# Patient Record
Sex: Female | Born: 1975 | Race: White | Hispanic: No | Smoking: Former smoker
Health system: Southern US, Community
[De-identification: ages and names within clinical notes are randomized; demographics above are authoritative.]

## PROBLEM LIST (undated history)

## (undated) DIAGNOSIS — N809 Endometriosis, unspecified: Secondary | ICD-10-CM

## (undated) DIAGNOSIS — E785 Hyperlipidemia, unspecified: Secondary | ICD-10-CM

## (undated) HISTORY — PX: BREAST CYST EXCISION: SHX579

## (undated) HISTORY — PX: ABDOMINAL HYSTERECTOMY: SHX81

## (undated) HISTORY — PX: TUBAL LIGATION: SHX77

## (undated) HISTORY — DX: Hyperlipidemia, unspecified: E78.5

## (undated) HISTORY — PX: BREAST CYST ASPIRATION: SHX578

## (undated) HISTORY — DX: Endometriosis, unspecified: N80.9

## (undated) HISTORY — PX: LAPAROSCOPY: SHX197

---

## 1998-05-07 ENCOUNTER — Other Ambulatory Visit: Admission: RE | Admit: 1998-05-07 | Discharge: 1998-05-07 | Payer: Self-pay | Admitting: Obstetrics and Gynecology

## 1998-06-12 ENCOUNTER — Emergency Department (HOSPITAL_COMMUNITY): Admission: EM | Admit: 1998-06-12 | Discharge: 1998-06-13 | Payer: Self-pay

## 1998-09-14 ENCOUNTER — Ambulatory Visit (HOSPITAL_COMMUNITY): Admission: RE | Admit: 1998-09-14 | Discharge: 1998-09-14 | Payer: Self-pay | Admitting: *Deleted

## 1998-10-18 ENCOUNTER — Inpatient Hospital Stay (HOSPITAL_COMMUNITY): Admission: AD | Admit: 1998-10-18 | Discharge: 1998-10-18 | Payer: Self-pay | Admitting: Obstetrics and Gynecology

## 1998-11-12 ENCOUNTER — Inpatient Hospital Stay (HOSPITAL_COMMUNITY): Admission: AD | Admit: 1998-11-12 | Discharge: 1998-11-14 | Payer: Self-pay | Admitting: Obstetrics and Gynecology

## 1999-06-21 ENCOUNTER — Other Ambulatory Visit: Admission: RE | Admit: 1999-06-21 | Discharge: 1999-06-21 | Payer: Self-pay | Admitting: Obstetrics and Gynecology

## 1999-12-01 ENCOUNTER — Emergency Department (HOSPITAL_COMMUNITY): Admission: EM | Admit: 1999-12-01 | Discharge: 1999-12-01 | Payer: Self-pay | Admitting: Emergency Medicine

## 2000-09-12 ENCOUNTER — Other Ambulatory Visit: Admission: RE | Admit: 2000-09-12 | Discharge: 2000-09-12 | Payer: Self-pay | Admitting: *Deleted

## 2001-08-01 ENCOUNTER — Other Ambulatory Visit: Admission: RE | Admit: 2001-08-01 | Discharge: 2001-08-01 | Payer: Self-pay | Admitting: *Deleted

## 2002-07-23 ENCOUNTER — Other Ambulatory Visit: Admission: RE | Admit: 2002-07-23 | Discharge: 2002-07-23 | Payer: Self-pay | Admitting: *Deleted

## 2002-12-28 ENCOUNTER — Emergency Department (HOSPITAL_COMMUNITY): Admission: EM | Admit: 2002-12-28 | Discharge: 2002-12-28 | Payer: Self-pay | Admitting: Emergency Medicine

## 2002-12-29 ENCOUNTER — Emergency Department (HOSPITAL_COMMUNITY): Admission: EM | Admit: 2002-12-29 | Discharge: 2002-12-29 | Payer: Self-pay | Admitting: Emergency Medicine

## 2002-12-29 ENCOUNTER — Encounter: Payer: Self-pay | Admitting: Emergency Medicine

## 2003-10-14 ENCOUNTER — Other Ambulatory Visit: Admission: RE | Admit: 2003-10-14 | Discharge: 2003-10-14 | Payer: Self-pay | Admitting: *Deleted

## 2004-09-28 ENCOUNTER — Other Ambulatory Visit: Admission: RE | Admit: 2004-09-28 | Discharge: 2004-09-28 | Payer: Self-pay | Admitting: Obstetrics and Gynecology

## 2005-01-18 ENCOUNTER — Other Ambulatory Visit: Admission: RE | Admit: 2005-01-18 | Discharge: 2005-01-18 | Payer: Self-pay | Admitting: Obstetrics and Gynecology

## 2005-03-06 ENCOUNTER — Inpatient Hospital Stay (HOSPITAL_COMMUNITY): Admission: AD | Admit: 2005-03-06 | Discharge: 2005-03-06 | Payer: Self-pay | Admitting: Obstetrics and Gynecology

## 2005-03-29 ENCOUNTER — Inpatient Hospital Stay (HOSPITAL_COMMUNITY): Admission: AD | Admit: 2005-03-29 | Discharge: 2005-03-31 | Payer: Self-pay | Admitting: Obstetrics and Gynecology

## 2006-03-21 ENCOUNTER — Other Ambulatory Visit: Admission: RE | Admit: 2006-03-21 | Discharge: 2006-03-21 | Payer: Self-pay | Admitting: *Deleted

## 2006-04-01 ENCOUNTER — Inpatient Hospital Stay (HOSPITAL_COMMUNITY): Admission: AD | Admit: 2006-04-01 | Discharge: 2006-04-01 | Payer: Self-pay | Admitting: *Deleted

## 2006-10-23 ENCOUNTER — Ambulatory Visit (HOSPITAL_COMMUNITY): Admission: RE | Admit: 2006-10-23 | Discharge: 2006-10-23 | Payer: Self-pay | Admitting: Family Medicine

## 2008-06-15 ENCOUNTER — Emergency Department (HOSPITAL_COMMUNITY): Admission: EM | Admit: 2008-06-15 | Discharge: 2008-06-15 | Payer: Self-pay | Admitting: Emergency Medicine

## 2008-12-10 ENCOUNTER — Emergency Department (HOSPITAL_COMMUNITY): Admission: EM | Admit: 2008-12-10 | Discharge: 2008-12-10 | Payer: Self-pay | Admitting: Family Medicine

## 2009-08-12 ENCOUNTER — Emergency Department (HOSPITAL_COMMUNITY): Admission: EM | Admit: 2009-08-12 | Discharge: 2009-08-12 | Payer: Self-pay | Admitting: Family Medicine

## 2009-09-16 ENCOUNTER — Ambulatory Visit (HOSPITAL_COMMUNITY): Admission: RE | Admit: 2009-09-16 | Discharge: 2009-09-17 | Payer: Self-pay | Admitting: Obstetrics and Gynecology

## 2009-09-16 ENCOUNTER — Encounter (INDEPENDENT_AMBULATORY_CARE_PROVIDER_SITE_OTHER): Payer: Self-pay | Admitting: Obstetrics and Gynecology

## 2010-03-16 ENCOUNTER — Emergency Department (HOSPITAL_COMMUNITY): Admission: EM | Admit: 2010-03-16 | Discharge: 2010-03-16 | Payer: Self-pay | Admitting: Emergency Medicine

## 2010-04-16 ENCOUNTER — Emergency Department (HOSPITAL_COMMUNITY): Admission: EM | Admit: 2010-04-16 | Discharge: 2010-04-16 | Payer: Self-pay | Admitting: Emergency Medicine

## 2010-09-21 ENCOUNTER — Emergency Department (HOSPITAL_COMMUNITY): Admission: EM | Admit: 2010-09-21 | Discharge: 2010-09-21 | Payer: Self-pay | Admitting: Emergency Medicine

## 2011-01-04 ENCOUNTER — Inpatient Hospital Stay (INDEPENDENT_AMBULATORY_CARE_PROVIDER_SITE_OTHER)
Admission: RE | Admit: 2011-01-04 | Discharge: 2011-01-04 | Disposition: A | Discharge status: Home / Self Care | Payer: BC Managed Care – PPO | Source: Ambulatory Visit | Attending: Family Medicine | Admitting: Family Medicine

## 2011-01-04 DIAGNOSIS — J019 Acute sinusitis, unspecified: Secondary | ICD-10-CM

## 2011-02-02 LAB — CBC
HCT: 30.1 % — ABNORMAL LOW (ref 36.0–46.0)
HCT: 38.4 % (ref 36.0–46.0)
Hemoglobin: 10.2 g/dL — ABNORMAL LOW (ref 12.0–15.0)
Hemoglobin: 12.8 g/dL (ref 12.0–15.0)
MCHC: 33.4 g/dL (ref 30.0–36.0)
MCHC: 33.8 g/dL (ref 30.0–36.0)
MCV: 90.7 fL (ref 78.0–100.0)
MCV: 91.4 fL (ref 78.0–100.0)
Platelets: 143 10*3/uL — ABNORMAL LOW (ref 150–400)
Platelets: 193 10*3/uL (ref 150–400)
RBC: 3.29 MIL/uL — ABNORMAL LOW (ref 3.87–5.11)
RBC: 4.24 MIL/uL (ref 3.87–5.11)
RDW: 12.6 % (ref 11.5–15.5)
RDW: 12.7 % (ref 11.5–15.5)
WBC: 11.8 10*3/uL — ABNORMAL HIGH (ref 4.0–10.5)
WBC: 8.5 10*3/uL (ref 4.0–10.5)

## 2011-02-15 LAB — POCT RAPID STREP A (OFFICE): Streptococcus, Group A Screen (Direct): POSITIVE — AB

## 2011-05-12 ENCOUNTER — Emergency Department (HOSPITAL_COMMUNITY)
Admission: EM | Admit: 2011-05-12 | Discharge: 2011-05-12 | Disposition: A | Discharge status: Home / Self Care | Payer: BC Managed Care – PPO | Attending: Emergency Medicine | Admitting: Emergency Medicine

## 2011-05-12 ENCOUNTER — Emergency Department (HOSPITAL_COMMUNITY): Payer: BC Managed Care – PPO

## 2011-05-12 DIAGNOSIS — W010XXA Fall on same level from slipping, tripping and stumbling without subsequent striking against object, initial encounter: Secondary | ICD-10-CM | POA: Insufficient documentation

## 2011-05-12 DIAGNOSIS — S93609A Unspecified sprain of unspecified foot, initial encounter: Secondary | ICD-10-CM | POA: Insufficient documentation

## 2011-05-12 DIAGNOSIS — M79609 Pain in unspecified limb: Secondary | ICD-10-CM | POA: Insufficient documentation

## 2011-05-12 DIAGNOSIS — M25579 Pain in unspecified ankle and joints of unspecified foot: Secondary | ICD-10-CM | POA: Insufficient documentation

## 2011-05-12 DIAGNOSIS — M25559 Pain in unspecified hip: Secondary | ICD-10-CM | POA: Insufficient documentation

## 2011-05-12 DIAGNOSIS — M25569 Pain in unspecified knee: Secondary | ICD-10-CM | POA: Insufficient documentation

## 2011-09-02 ENCOUNTER — Inpatient Hospital Stay (INDEPENDENT_AMBULATORY_CARE_PROVIDER_SITE_OTHER)
Admission: RE | Admit: 2011-09-02 | Discharge: 2011-09-02 | Disposition: A | Discharge status: Home / Self Care | Payer: BC Managed Care – PPO | Source: Ambulatory Visit | Attending: Family Medicine | Admitting: Family Medicine

## 2011-09-02 DIAGNOSIS — J40 Bronchitis, not specified as acute or chronic: Secondary | ICD-10-CM

## 2011-09-02 DIAGNOSIS — J019 Acute sinusitis, unspecified: Secondary | ICD-10-CM

## 2011-12-30 ENCOUNTER — Encounter (INDEPENDENT_AMBULATORY_CARE_PROVIDER_SITE_OTHER): Payer: BC Managed Care – PPO | Admitting: Obstetrics and Gynecology

## 2011-12-30 DIAGNOSIS — Z85850 Personal history of malignant neoplasm of thyroid: Secondary | ICD-10-CM

## 2011-12-30 DIAGNOSIS — N951 Menopausal and female climacteric states: Secondary | ICD-10-CM

## 2012-02-16 ENCOUNTER — Emergency Department (HOSPITAL_COMMUNITY)
Admission: EM | Admit: 2012-02-16 | Discharge: 2012-02-16 | Disposition: A | Discharge status: Hospice | Payer: BC Managed Care – PPO | Attending: Emergency Medicine | Admitting: Emergency Medicine

## 2012-02-16 ENCOUNTER — Encounter (HOSPITAL_COMMUNITY): Payer: Self-pay | Admitting: Emergency Medicine

## 2012-02-16 DIAGNOSIS — J329 Chronic sinusitis, unspecified: Secondary | ICD-10-CM | POA: Insufficient documentation

## 2012-02-16 MED ORDER — CETIRIZINE-PSEUDOEPHEDRINE ER 5-120 MG PO TB12: 1.0000 | ORAL_TABLET | Freq: Every day | ORAL | Status: DC

## 2012-02-16 MED ORDER — FLUTICASONE PROPIONATE 50 MCG/ACT NA SUSP: 2.0000 | Freq: Every day | NASAL | Status: DC

## 2012-02-16 NOTE — ED Notes (Signed)
Pt c/o sinus pain, denies SOB or other complaints, NAD

## 2012-02-16 NOTE — ED Provider Notes (Signed)
History     CSN: [id number]  Arrival date & time 02/16/12  4098   First MD Initiated Contact with Patient 02/16/12 1007      Chief Complaint  Patient presents with  . Facial Pain    (Consider location/radiation/quality/duration/timing/severity/associated sxs/prior treatment) Patient is a 36 y.o. female presenting with sinusitis. The history is provided by the patient.  Sinusitis  This is a new problem. The current episode started more than 2 days ago. The problem has not changed since onset.There has been no fever. The pain is at a severity of 3/10. The pain is mild. The pain has been constant since onset. Associated symptoms include congestion and sinus pressure. Pertinent negatives include no chills, no ear pain, no hoarse voice, no sore throat, no cough and no shortness of breath.  Pt states she gets sinus infections every year around this time. States the only thing that works is antibiotics. States yellow nasal discharge, pressure over maxillary sinuses. Tried netty pot, dayquil with no relief. No fever, chills, no cough, no other complaints.   History reviewed. No pertinent past medical history.  Past Surgical History  Procedure Date  . Abdominal hysterectomy     No family history on file.  History  Substance Use Topics  . Smoking status: Never Smoker   . Smokeless tobacco: Not on file  . Alcohol Use: No    OB History    Grav Para Term Preterm Abortions TAB SAB Ect Mult Living                  Review of Systems  Constitutional: Negative for fever and chills.  HENT: Positive for congestion and sinus pressure. Negative for ear pain, sore throat, hoarse voice, neck pain and neck stiffness.   Eyes: Negative.   Respiratory: Negative for cough and shortness of breath.   Cardiovascular: Negative.   Gastrointestinal: Negative.   Musculoskeletal: Negative for myalgias.  Skin: Negative.   Neurological: Negative for dizziness and headaches.    Allergies  Review of  patient's allergies indicates no known allergies.  Home Medications   Current Outpatient Rx  Name Route Sig Dispense Refill  . CALCIUM CARBONATE-VITAMIN D 500-200 MG-UNIT PO TABS Oral Take 1 tablet by mouth at bedtime.    Marland Kitchen ESTRADIOL 1 MG PO TABS Oral Take 1 mg by mouth at bedtime.      BP 111/76  Pulse 79  Temp(Src) 97.6 F (36.4 C) (Oral)  Resp 16  Wt 150 lb (68.04 kg)  SpO2 100%  Physical Exam  Nursing note and vitals reviewed. Constitutional: She appears well-developed and well-nourished. No distress.  HENT:  Head: Normocephalic.  Right Ear: Tympanic membrane, external ear and ear canal normal.  Left Ear: Tympanic membrane, external ear and ear canal normal.  Nose: Rhinorrhea present. Right sinus exhibits maxillary sinus tenderness. Right sinus exhibits no frontal sinus tenderness. Left sinus exhibits maxillary sinus tenderness. Left sinus exhibits no frontal sinus tenderness.  Mouth/Throat: Uvula is midline, oropharynx is clear and moist and mucous membranes are normal. No oropharyngeal exudate, posterior oropharyngeal edema, posterior oropharyngeal erythema or tonsillar abscesses.  Eyes: Conjunctivae are normal.  Neck: Normal range of motion. Neck supple.  Cardiovascular: Normal rate, regular rhythm and normal heart sounds.   Pulmonary/Chest: Effort normal and breath sounds normal. No respiratory distress. She has no wheezes. She has no rales.  Musculoskeletal: Normal range of motion. She exhibits no edema.  Lymphadenopathy:    She has no cervical adenopathy.  Neurological: She is alert.  Skin: Skin is warm and dry.  Psychiatric: She has a normal mood and affect.    ED Course  Procedures (including critical care time)  Pt with maxillary sinuses pressure, and nasal congestion. She is afebrile, non toxic, no other complaints. This sounds like possible allergic vs viral sinusitis. Will start on flonase, continue saline flushes, zyrtec, sudafed. Will follow up with  pcp  1. Sinusitis       MDM         Lottie Mussel, PA 02/16/12 1620

## 2012-02-16 NOTE — Discharge Instructions (Signed)
Continue saline nasal spray or netti pot. Start taking zyrtec-sudafed as prescribed. Start daily flonase for sinusitis. Follow up with a primary care doctor if not improving.   RESOURCE GUIDE  Dental Problems  Patients with Medicaid: [facility] [phone] [address]                                           [phone] [address] Phone:  [phone]                                                  Phone:  [phone]  If unable to pay or uninsured, contact:  [facility] or [facility]. to become qualified for the adult dental clinic.  Chronic Pain Problems Contact [facility]  [phone] Patients need to be referred by their primary care doctor.  Insufficient Money for Medicine Contact [employer]:  call "211" or [facility] [phone].  No Primary Care Doctor Call [facility]  [phone] Other agencies that provide inexpensive medical care    [facility]  [phone]    [facility]  [phone]    [facility]  [phone]    [facility]  [phone]    [facility]  [phone]    [facility]  [phone]  [facility]  [phone] [facility]  [phone] [facility]   [phone] (emergency services [phone])  Substance Abuse Resources [facility]  [phone] [facility] [phone] [facility] 712-656-0309 [facility] [phone] Residential & Outpatient Substance Abuse Program  [phone]  Abuse/Neglect [facility] Child Abuse Hotline 816-708-3206 [facility] Child Abuse Hotline (216)622-1216 (After Hours)  Emergency Shelter [facility] [phone]  Maternity Homes [facility] [place] [facility] [phone] [facility] [phone]  MRSA Hotline #:   4798090127    [facility]  Resources  [facility] [place]     [employer]                          [facility] Dept. [address] [place]                       [address]      [address]  [place]                                                Cristobal Goldmann Phone:  [phone]                                   Phone:  [phone]                 Phone:  [phone]  [facility] Mental Health Phone:  [phone]  [facility] Child Abuse Hotline [phone] [phone] (After Hours)

## 2012-02-17 NOTE — ED Provider Notes (Signed)
Medical screening examination/treatment/procedure(s) were performed by non-physician practitioner and as supervising physician I was immediately available for consultation/collaboration.    Celene Kras, MD 02/17/12 [phone]

## 2012-02-20 ENCOUNTER — Encounter (HOSPITAL_COMMUNITY): Payer: Self-pay | Admitting: Emergency Medicine

## 2012-02-20 ENCOUNTER — Emergency Department (INDEPENDENT_AMBULATORY_CARE_PROVIDER_SITE_OTHER)
Admission: EM | Admit: 2012-02-20 | Discharge: 2012-02-20 | Disposition: A | Discharge status: Home / Self Care | Payer: BC Managed Care – PPO | Source: Home / Self Care | Attending: Emergency Medicine | Admitting: Emergency Medicine

## 2012-02-20 DIAGNOSIS — J329 Chronic sinusitis, unspecified: Secondary | ICD-10-CM

## 2012-02-20 MED ORDER — CETIRIZINE-PSEUDOEPHEDRINE ER 5-120 MG PO TB12: 1.0000 | ORAL_TABLET | Freq: Every day | ORAL | Status: DC

## 2012-02-20 MED ORDER — AMOXICILLIN 500 MG PO CAPS: 500.0000 mg | ORAL_CAPSULE | Freq: Three times a day (TID) | ORAL | Status: AC

## 2012-02-20 NOTE — ED Notes (Signed)
Pt here with sinus sx pain behind eyes,sore throat and throb h/a that started last Thursday seen in [place] and prescribed Flonase spray and zyrtec but pt states the sx are worsening with chest/back pressure and ache and no relief in h/a.vss

## 2012-02-20 NOTE — ED Provider Notes (Signed)
History     CSN: [id number]  Arrival date & time 02/20/12  4098   First MD Initiated Contact with Patient 02/20/12 [phone]      Chief Complaint  Patient presents with  . Sinusitis  . URI    (Consider location/radiation/quality/duration/timing/severity/associated sxs/prior treatment) HPI Comments: Continue to feel the same or even worse pressure on my sinuses" (points to both maxillary sinuses), with dripping behind my throat, and a sore throat, with some coughing  Patient is a 36 y.o. female presenting with sinusitis and URI. The history is provided by the patient.  Sinusitis  This is a new problem. The problem has been gradually worsening. There has been no fever. The pain is at a severity of 7/10. The pain is moderate. The pain has been constant since onset. Associated symptoms include congestion and ear pain. Pertinent negatives include no chills, no swollen glands, no cough and no shortness of breath. She has tried other medications for the symptoms. The treatment provided no relief.  URI The primary symptoms include ear pain. Primary symptoms do not include fever, fatigue, swollen glands or cough.  Symptoms associated with the illness include congestion. The illness is not associated with chills.    History reviewed. No pertinent past medical history.  Past Surgical History  Procedure Date  . Abdominal hysterectomy     No family history on file.  History  Substance Use Topics  . Smoking status: Never Smoker   . Smokeless tobacco: Not on file  . Alcohol Use: No    OB History    Grav Para Term Preterm Abortions TAB SAB Ect Mult Living                  Review of Systems  Constitutional: Negative for fever, chills, activity change, appetite change and fatigue.  HENT: Positive for ear pain and congestion.   Respiratory: Negative for cough and shortness of breath.     Allergies  Review of patient's allergies indicates no known allergies.  Home Medications    Current Outpatient Rx  Name Route Sig Dispense Refill  . AMOXICILLIN 500 MG PO CAPS Oral Take 1 capsule (500 mg total) by mouth 3 (three) times daily. 30 capsule 0  . CALCIUM CARBONATE-VITAMIN D 500-200 MG-UNIT PO TABS Oral Take 1 tablet by mouth at bedtime.    Marland Kitchen CETIRIZINE-PSEUDOEPHEDRINE ER 5-120 MG PO TB12 Oral Take 1 tablet by mouth daily. 30 tablet 0  . ESTRADIOL 1 MG PO TABS Oral Take 1 mg by mouth at bedtime.    Marland Kitchen FLUTICASONE PROPIONATE 50 MCG/ACT NA SUSP Nasal Place 2 sprays into the nose daily. 16 g 1    BP 116/64  Pulse 82  Temp(Src) 98.4 F (36.9 C) (Oral)  Resp 16  SpO2 100%  Physical Exam  Nursing note and vitals reviewed. Constitutional: She appears well-developed and well-nourished.  HENT:  Head: Normocephalic.    Right Ear: Tympanic membrane normal.  Left Ear: Tympanic membrane normal.  Mouth/Throat: Uvula is midline and mucous membranes are normal. Posterior oropharyngeal erythema present. No oropharyngeal exudate.  Eyes: Conjunctivae and EOM are normal.  Neck: Neck supple.  Cardiovascular: Normal rate.   Pulmonary/Chest: Effort normal and breath sounds normal. No respiratory distress.  Lymphadenopathy:    She has no cervical adenopathy.  Skin: No erythema.    ED Course  Procedures (including critical care time)  Labs Reviewed - No data to display No results found.   1. Sinusitis       MDM  Sinusitis, failing decongestant and antihistaminic treatment-worsening. Afebrile optimized treatment encouraged to wait 2 more days before antibiotic commitment.        Jimmie Molly, MD 02/20/12 1731

## 2012-02-20 NOTE — Discharge Instructions (Signed)
  As discussed use prescribed medicine for 1-2 days and if no improvement along with aggressive hydration and humidifier use start with prescribed antibiotics. Continue with your nasal steroid sprays. Once you complete Zyrtec D. cycle resumes oral normal over-the-counter Zyrtec  Sinusitis Sinuses are air pockets within the bones of your face. The growth of bacteria within a sinus leads to infection. The infection prevents the sinuses from draining. This infection is called sinusitis. SYMPTOMS  There will be different areas of pain depending on which sinuses have become infected.  The maxillary sinuses often produce pain beneath the eyes.   Frontal sinusitis may cause pain in the middle of the forehead and above the eyes.  Other problems (symptoms) include:  Toothaches.   Colored, pus-like (purulent) drainage from the nose.   Swelling, warmth, and tenderness over the sinus areas may be signs of infection.  TREATMENT  Sinusitis is most often determined by an exam.X-rays may be taken. If x-rays have been taken, make sure you obtain your results or find out how you are to obtain them. Your caregiver may give you medications (antibiotics). These are medications that will help kill the bacteria causing the infection. You may also be given a medication (decongestant) that helps to reduce sinus swelling.  HOME CARE INSTRUCTIONS   Only take over-the-counter or prescription medicines for pain, discomfort, or fever as directed by your caregiver.   Drink extra fluids. Fluids help thin the mucus so your sinuses can drain more easily.   Applying either moist heat or ice packs to the sinus areas may help relieve discomfort.   Use saline nasal sprays to help moisten your sinuses. The sprays can be found at your local drugstore.  SEEK IMMEDIATE MEDICAL CARE IF:  You have a fever.   You have increasing pain, severe headaches, or toothache.   You have nausea, vomiting, or drowsiness.   You develop  unusual swelling around the face or trouble seeing.  MAKE SURE YOU:   Understand these instructions.   Will watch your condition.   Will get help right away if you are not doing well or get worse.  Document Released: 10/17/2005 Document Revised: 10/06/2011 Document Reviewed: 05/16/2007 [facility] Patient Information 2012 [place], [place].

## 2012-10-09 ENCOUNTER — Telehealth: Payer: Self-pay | Admitting: Obstetrics and Gynecology

## 2012-10-09 NOTE — Telephone Encounter (Signed)
VM from pt requesting Estrogen until appt 11/07/12.

## 2012-10-10 ENCOUNTER — Other Ambulatory Visit: Payer: Self-pay | Admitting: Obstetrics and Gynecology

## 2012-10-10 ENCOUNTER — Ambulatory Visit: Payer: BC Managed Care – PPO | Admitting: Obstetrics and Gynecology

## 2012-10-10 MED ORDER — ESTRADIOL 1 MG PO TABS: 1.0000 mg | ORAL_TABLET | Freq: Every day | ORAL | Status: DC

## 2012-10-10 NOTE — Telephone Encounter (Signed)
Pt e-prescribed refill of Estrogen until AEX 11/07/12.

## 2012-11-02 DIAGNOSIS — N809 Endometriosis, unspecified: Secondary | ICD-10-CM | POA: Insufficient documentation

## 2012-11-07 ENCOUNTER — Ambulatory Visit (INDEPENDENT_AMBULATORY_CARE_PROVIDER_SITE_OTHER): Payer: BC Managed Care – PPO

## 2012-11-07 ENCOUNTER — Ambulatory Visit (INDEPENDENT_AMBULATORY_CARE_PROVIDER_SITE_OTHER): Payer: BC Managed Care – PPO | Admitting: Obstetrics and Gynecology

## 2012-11-07 ENCOUNTER — Encounter: Payer: Self-pay | Admitting: Obstetrics and Gynecology

## 2012-11-07 ENCOUNTER — Telehealth: Payer: Self-pay | Admitting: Obstetrics and Gynecology

## 2012-11-07 VITALS — BP 100/64 | HR 70 | Resp 16 | Ht 67.0 in | Wt 150.0 lb

## 2012-11-07 DIAGNOSIS — Z01419 Encounter for gynecological examination (general) (routine) without abnormal findings: Secondary | ICD-10-CM

## 2012-11-07 DIAGNOSIS — Z13820 Encounter for screening for osteoporosis: Secondary | ICD-10-CM

## 2012-11-07 DIAGNOSIS — Z124 Encounter for screening for malignant neoplasm of cervix: Secondary | ICD-10-CM

## 2012-11-07 MED ORDER — ESTRADIOL 1 MG PO TABS: 1.0000 mg | ORAL_TABLET | Freq: Every day | ORAL | Status: DC

## 2012-11-07 NOTE — Progress Notes (Addendum)
Contraception Hysterectomy and BSO secondary to endometriosis Last pap 08/13/2010 Last Mammo None Last Colonoscopy None Last Dexa Scan None Primary MD [facility] Abuse at Home None  No complaints.  Interested in BDS secondary to lack of ovaries for several yrs now taking estradiol and needs refills.  Filed Vitals:   11/07/12 1603  BP: 100/64  Pulse: 70  Resp: 16   ROS: noncontributory  Physical Examination: General appearance - alert, well appearing, and in no distress Neck - supple, no significant adenopathy Chest - clear to auscultation, no wheezes, rales or rhonchi, symmetric air entry Heart - normal rate and regular rhythm Abdomen - soft, nontender, nondistended, no masses or organomegaly Breasts - breasts appear normal, no suspicious masses, no skin or nipple changes or axillary nodes Pelvic - normal external genitalia, vulva, vagina, vaginal cuff appears nl Back exam - no CVAT Extremities - no edema, redness or tenderness in the calves or thighs  A/P Baseline BDS secondary to h/o bso for several yrs - nl BDS today - consider repeat in 68yrs rec Ca with Vit D Rx estradiol

## 2012-11-08 NOTE — Addendum Note (Signed)
Addended by: Stephens Shire on: 11/08/2012 12:48 PM   Modules accepted: Orders

## 2012-11-09 LAB — PAP IG W/ RFLX HPV ASCU

## 2013-02-10 ENCOUNTER — Emergency Department (INDEPENDENT_AMBULATORY_CARE_PROVIDER_SITE_OTHER)
Admission: EM | Admit: 2013-02-10 | Discharge: 2013-02-10 | Disposition: A | Discharge status: Home / Self Care | Payer: BC Managed Care – PPO | Source: Home / Self Care | Attending: Family Medicine | Admitting: Family Medicine

## 2013-02-10 ENCOUNTER — Encounter (HOSPITAL_COMMUNITY): Payer: Self-pay | Admitting: *Deleted

## 2013-02-10 DIAGNOSIS — J329 Chronic sinusitis, unspecified: Secondary | ICD-10-CM

## 2013-02-10 DIAGNOSIS — J40 Bronchitis, not specified as acute or chronic: Secondary | ICD-10-CM

## 2013-02-10 MED ORDER — CETIRIZINE-PSEUDOEPHEDRINE ER 5-120 MG PO TB12: 1.0000 | ORAL_TABLET | Freq: Two times a day (BID) | ORAL | Status: DC

## 2013-02-10 MED ORDER — ALBUTEROL SULFATE HFA 108 (90 BASE) MCG/ACT IN AERS: 1.0000 | INHALATION_SPRAY | Freq: Four times a day (QID) | RESPIRATORY_TRACT | Status: DC | PRN

## 2013-02-10 MED ORDER — AMOXICILLIN 500 MG PO CAPS: 500.0000 mg | ORAL_CAPSULE | Freq: Three times a day (TID) | ORAL | Status: DC

## 2013-02-10 MED ORDER — PREDNISONE 20 MG PO TABS: ORAL_TABLET | ORAL | Status: DC

## 2013-02-10 MED ORDER — IBUPROFEN 600 MG PO TABS: 600.0000 mg | ORAL_TABLET | Freq: Three times a day (TID) | ORAL | Status: DC | PRN

## 2013-02-10 NOTE — ED Provider Notes (Signed)
History     CSN: [id number]  Arrival date & time 02/10/13  1101   First MD Initiated Contact with Patient 02/10/13 1108      Chief Complaint  Patient presents with  . Sore Throat    (Consider location/radiation/quality/duration/timing/severity/associated sxs/prior treatment) HPI Comments: 37 year old smoker female with history of recurrent sinusitis. Here complaining of nasal congestion postnasal drip for one week. Symptoms not associated with sore throat, fever and chills fever up to 100.1 F last night. Also reports coughing spells with sporadic wheezing and yellow sputum. Her nasal discharge is now thick and yellow as well. She's also experiencing sinus tenderness. 44-year-old daughter diagnose today with strep positive test.  Patient also reports she was doing her work around the house yesterday lifting a things with her right hand. Today she experienced right-sided neck pain at wake up worse with movement.   Past Medical History  Diagnosis Date  . Endometriosis     Past Surgical History  Procedure Laterality Date  . Abdominal hysterectomy    . Tubal ligation    . Laparoscopy      No family history on file.  History  Substance Use Topics  . Smoking status: Never Smoker   . Smokeless tobacco: Not on file  . Alcohol Use: No    OB History   Grav Para Term Preterm Abortions TAB SAB Ect Mult Living   2 2              Review of Systems  Constitutional: Positive for fever and chills.  HENT: Positive for congestion, sore throat, rhinorrhea, neck pain and sinus pressure. Negative for trouble swallowing and neck stiffness.   Respiratory: Positive for cough, shortness of breath and wheezing.   Cardiovascular: Negative for chest pain and leg swelling.  Gastrointestinal: Negative for nausea, vomiting and abdominal pain.  Skin: Negative for rash.  Neurological: Positive for headaches.    Allergies  Review of patient's allergies indicates no known allergies.  Home  Medications   Current Outpatient Rx  Name  Route  Sig  Dispense  Refill  . albuterol (PROVENTIL HFA;VENTOLIN HFA) 108 (90 BASE) MCG/ACT inhaler   Inhalation   Inhale 1-2 puffs into the lungs every 6 (six) hours as needed for wheezing.   1 Inhaler   0   . amoxicillin (AMOXIL) 500 MG capsule   Oral   Take 1 capsule (500 mg total) by mouth 3 (three) times daily.   21 capsule   0   . calcium-vitamin D (OSCAL WITH D) 500-200 MG-UNIT per tablet   Oral   Take 1 tablet by mouth at bedtime.         . cetirizine-pseudoephedrine (ZYRTEC-D) 5-120 MG per tablet   Oral   Take 1 tablet by mouth 2 (two) times daily.   30 tablet   0   . estradiol (ESTRACE) 1 MG tablet   Oral   Take 1 tablet (1 mg total) by mouth at bedtime.   30 tablet   11   . ibuprofen (ADVIL,MOTRIN) 600 MG tablet   Oral   Take 1 tablet (600 mg total) by mouth every 8 (eight) hours as needed for pain.   20 tablet   0   . predniSONE (DELTASONE) 20 MG tablet      2 tabs by mouth daily for 5 days   10 tablet   0     BP 114/79  Pulse 84  Temp(Src) 98.9 F (37.2 C) (Oral)  Resp 16  SpO2  100%  Physical Exam  Nursing note and vitals reviewed. Constitutional: She is oriented to person, place, and time. She appears well-developed and well-nourished. No distress.  HENT:  Head: Normocephalic and atraumatic.  Right Ear: External ear normal.  Left Ear: External ear normal.  Nasal Congestion with erythema and swelling of nasal turbinates, septal deviation towards right. White/yellow rhinorrhea. Significant pharyngeal erythema no exudates. No uvula deviation. No trismus. TM's normal.  Eyes: Conjunctivae and EOM are normal. Pupils are equal, round, and reactive to light. Right eye exhibits no discharge. Left eye exhibits no discharge.  Neck: Neck supple.  Cardiovascular: Normal rate, regular rhythm and normal heart sounds.   Pulmonary/Chest: Effort normal. She has no wheezes. She has no rales. She exhibits no  tenderness.  Scant sporadic rhonchi bilaterally. No active wheezing or dyspnea. Bronchitic cough.  Musculoskeletal:  Tenderness of right trapezius muscle increased with ROM.   Lymphadenopathy:    She has cervical adenopathy.  Neurological: She is alert and oriented to person, place, and time.  Skin: No rash noted. She is not diaphoretic.    ED Course  Procedures (including critical care time)  Labs Reviewed - No data to display No results found.   1. Bronchitis   2. Sinusitis       MDM  Daughter with strep positive test here.  Patient treated with amoxicillin, albuterol, cetirizine/pseudoephedrine, ibuprofen and prednisone. Supportive care and red flags that should prompt her return to medical attention discussed with patient and provided in writing.       Sharin Grave, MD 02/11/13 1015

## 2013-02-10 NOTE — ED Notes (Signed)
Patient complains of sore throat and yellow drainage from sinuses x 1 week with fever/chills. Patient states pain now radiates to back of neck.

## 2014-09-01 ENCOUNTER — Encounter (HOSPITAL_COMMUNITY): Payer: Self-pay | Admitting: *Deleted

## 2014-11-17 ENCOUNTER — Encounter: Payer: Self-pay | Admitting: *Deleted

## 2015-07-21 ENCOUNTER — Ambulatory Visit (INDEPENDENT_AMBULATORY_CARE_PROVIDER_SITE_OTHER): Payer: BLUE CROSS/BLUE SHIELD | Admitting: Family Medicine

## 2015-07-21 ENCOUNTER — Encounter: Payer: Self-pay | Admitting: Family Medicine

## 2015-07-21 VITALS — BP 126/70 | HR 68 | Temp 98.6°F | Resp 16 | Ht 67.0 in | Wt 170.0 lb

## 2015-07-21 DIAGNOSIS — Z0000 Encounter for general adult medical examination without abnormal findings: Secondary | ICD-10-CM | POA: Diagnosis not present

## 2015-07-21 NOTE — Progress Notes (Signed)
Subjective:    Patient ID: Alison Mayer, female    DOB: 11-Mar-1976, 39 y.o.   MRN: [id number]  HPI  Patient is a very pleasant 38 year old white female here today to establish care. Past medical history is significant for a total abdominal hysterectomy/BSO. This was performed due to endometriosis in 2010. Cessation states she had normal Pap smears in 2011 and 2012. However she does have a history of precancerous cells on previous Pap smears. She also has a family history of breast cancer in her grandmother. She had a mammogram performed at age 79. His recommendations were every 2 years. She is due again for mammogram at age 82. Otherwise she has no concerns Past Medical History  Diagnosis Date  . Endometriosis    Past Surgical History  Procedure Laterality Date  . Abdominal hysterectomy    . Tubal ligation    . Laparoscopy     Current Outpatient Prescriptions on File Prior to Visit  Medication Sig Dispense Refill  . estradiol (ESTRACE) 1 MG tablet Take 1 tablet (1 mg total) by mouth at bedtime. 30 tablet 11   No current facility-administered medications on file prior to visit.   No Known Allergies Social History   Social History  . Marital Status: Married    Spouse Name: N/A  . Number of Children: N/A  . Years of Education: N/A   Occupational History  . Not on file.   Social History Main Topics  . Smoking status: Former Smoker    Quit date: 02/28/2014  . Smokeless tobacco: Never Used  . Alcohol Use: No  . Drug Use: No  . Sexual Activity: Yes    Birth Control/ Protection: None     Comment: Hysterectomy   Other Topics Concern  . Not on file   Social History Narrative   Family History  Problem Relation Age of Onset  . Cancer Maternal Grandmother      Review of Systems  All other systems reviewed and are negative.      Objective:   Physical Exam  Constitutional: She is oriented to person, place, and time. She appears well-developed and well-nourished.  No distress.  HENT:  Head: Normocephalic and atraumatic.  Right Ear: External ear normal.  Left Ear: External ear normal.  Nose: Nose normal.  Mouth/Throat: Oropharynx is clear and moist. No oropharyngeal exudate.  Eyes: Conjunctivae and EOM are normal. Pupils are equal, round, and reactive to light. Right eye exhibits no discharge. Left eye exhibits no discharge. No scleral icterus.  Neck: Normal range of motion. Neck supple. No JVD present. No tracheal deviation present. No thyromegaly present.  Cardiovascular: Normal rate, regular rhythm, normal heart sounds and intact distal pulses.  Exam reveals no gallop and no friction rub.   No murmur heard. Pulmonary/Chest: Effort normal and breath sounds normal. No stridor. No respiratory distress. She has no wheezes. She has no rales. She exhibits no tenderness.  Abdominal: Soft. Bowel sounds are normal. She exhibits no distension and no mass. There is no tenderness. There is no rebound and no guarding.  Musculoskeletal: Normal range of motion. She exhibits no edema or tenderness.  Lymphadenopathy:    She has no cervical adenopathy.  Neurological: She is alert and oriented to person, place, and time. She has normal reflexes. She displays normal reflexes. No cranial nerve deficit. She exhibits normal muscle tone. Coordination normal.  Skin: Skin is warm. No rash noted. She is not diaphoretic. No erythema. No pallor.  Psychiatric: She has a  normal mood and affect. Her behavior is normal. Judgment and thought content normal.  Vitals reviewed.         Assessment & Plan:  Routine general medical examination at a health care facility - Plan: CBC with Differential/Platelet, COMPLETE METABOLIC PANEL WITH GFR, Lipid panel, TSH  Physical exam is completely normal. I would like the patient to return fasting for a CBC, CMP, fasting lipid panel, and TSH. I would recommend performing a Pap smear at her next physical exam. If this is clear I would  discontinue all Pap smears in the future. I would recommend mammogram every 2 years until age 16 and then every year after that. Also recommended a flu shot today but the patient politely declined.

## 2015-07-22 ENCOUNTER — Other Ambulatory Visit: Payer: BLUE CROSS/BLUE SHIELD

## 2015-07-22 LAB — TSH: TSH: 2.638 u[IU]/mL (ref 0.350–4.500)

## 2015-07-22 LAB — CBC WITH DIFFERENTIAL/PLATELET
BASOS PCT: 1 % (ref 0–1)
Basophils Absolute: 0.1 10*3/uL (ref 0.0–0.1)
EOS ABS: 0.1 10*3/uL (ref 0.0–0.7)
Eosinophils Relative: 2 % (ref 0–5)
HCT: 39 % (ref 36.0–46.0)
Hemoglobin: 12.8 g/dL (ref 12.0–15.0)
Lymphocytes Relative: 27 % (ref 12–46)
Lymphs Abs: 1.5 10*3/uL (ref 0.7–4.0)
MCH: 29.1 pg (ref 26.0–34.0)
MCHC: 32.8 g/dL (ref 30.0–36.0)
MCV: 88.6 fL (ref 78.0–100.0)
MONOS PCT: 9 % (ref 3–12)
MPV: 10.4 fL (ref 8.6–12.4)
Monocytes Absolute: 0.5 10*3/uL (ref 0.1–1.0)
NEUTROS PCT: 61 % (ref 43–77)
Neutro Abs: 3.4 10*3/uL (ref 1.7–7.7)
PLATELETS: 206 10*3/uL (ref 150–400)
RBC: 4.4 MIL/uL (ref 3.87–5.11)
RDW: 13.7 % (ref 11.5–15.5)
WBC: 5.6 10*3/uL (ref 4.0–10.5)

## 2015-07-22 LAB — COMPLETE METABOLIC PANEL WITH GFR
ALT: 45 U/L — ABNORMAL HIGH (ref 6–29)
AST: 28 U/L (ref 10–30)
Albumin: 4.3 g/dL (ref 3.6–5.1)
Alkaline Phosphatase: 71 U/L (ref 33–115)
BUN: 8 mg/dL (ref 7–25)
CO2: 23 mmol/L (ref 20–31)
Calcium: 9.3 mg/dL (ref 8.6–10.2)
Chloride: 105 mmol/L (ref 98–110)
Creat: 0.81 mg/dL (ref 0.50–1.10)
GFR, Est African American: >89 mL/min (ref >=60)
GLUCOSE: 98 mg/dL (ref 70–99)
POTASSIUM: 4.4 mmol/L (ref 3.5–5.3)
SODIUM: 138 mmol/L (ref 135–146)
Total Bilirubin: 0.3 mg/dL (ref 0.2–1.2)
Total Protein: 6.6 g/dL (ref 6.1–8.1)

## 2015-07-22 LAB — LIPID PANEL
CHOLESTEROL: 222 mg/dL — AB (ref 125–200)
HDL: 31 mg/dL — ABNORMAL LOW (ref >=46)
LDL CALC: 132 mg/dL — AB (ref <130)
TRIGLYCERIDES: 297 mg/dL — AB (ref <150)
Total CHOL/HDL Ratio: 7.2 Ratio — ABNORMAL HIGH (ref <=5.0)
VLDL: 59 mg/dL — ABNORMAL HIGH (ref <30)

## 2015-07-23 ENCOUNTER — Encounter: Payer: Self-pay | Admitting: Family Medicine

## 2015-08-18 ENCOUNTER — Telehealth: Payer: Self-pay | Admitting: Family Medicine

## 2015-08-18 MED ORDER — ESTRADIOL 1 MG PO TABS: 1.0000 mg | ORAL_TABLET | Freq: Every day | ORAL | Status: DC

## 2015-08-18 NOTE — Telephone Encounter (Signed)
Patient needs refill on her estradiol 1 mg  Pharmacy [facility] [address]  [phone] if any questions

## 2015-08-18 NOTE — Telephone Encounter (Signed)
Medication called/sent to requested pharmacy  

## 2016-02-15 ENCOUNTER — Ambulatory Visit (INDEPENDENT_AMBULATORY_CARE_PROVIDER_SITE_OTHER): Payer: BLUE CROSS/BLUE SHIELD | Admitting: Family Medicine

## 2016-02-15 ENCOUNTER — Encounter: Payer: Self-pay | Admitting: Family Medicine

## 2016-02-15 VITALS — BP 116/80 | HR 80 | Temp 99.7°F | Resp 14 | Ht 67.0 in | Wt 170.0 lb

## 2016-02-15 DIAGNOSIS — J069 Acute upper respiratory infection, unspecified: Secondary | ICD-10-CM

## 2016-02-15 DIAGNOSIS — J0100 Acute maxillary sinusitis, unspecified: Secondary | ICD-10-CM | POA: Diagnosis not present

## 2016-02-15 MED ORDER — AMOXICILLIN-POT CLAVULANATE 875-125 MG PO TABS: 1.0000 | ORAL_TABLET | Freq: Two times a day (BID) | ORAL | Status: DC

## 2016-02-15 NOTE — Patient Instructions (Signed)
Sudafed from pharmacist  Take antibiotics Nasal saline rinse  Work note- 4/17-4/18- can return Wed 02/17/16

## 2016-02-15 NOTE — Progress Notes (Signed)
Patient ID: Alison Mayer, female   DOB: 09/10/1976, 40 y.o.   MRN: [id number]    Subjective:    Patient ID: Alison Mayer, female    DOB: 09/12/1976, 40 y.o.   MRN: [id number]  Patient presents for Illness Patient here with sinus pressure or drainage sore throat she had a little chest tightness the past 3 days . She is in former smoker. She's also been running fever she states that her normal temperature is about 97.5 she took NyQuil ,nasal saline, vicks vapor rub and fever reducer. No known sick contacts. The worst part is the sinus pressure and drainage feels like her previous sinus infections which she typically gets once a year. Minimal cough   Review Of Systems:  GEN- denies fatigue, fever, weight loss,weakness, recent illness HEENT- denies eye drainage, change in vision, +nasal discharge, CVS- denies chest pain, palpitations RESP- denies SOB, cough, wheeze ABD- denies N/V, change in stools, abd pain GU- denies dysuria, hematuria, dribbling, incontinence MSK- denies joint pain, muscle aches, injury Neuro- denies headache, dizziness, syncope, seizure activity       Objective:    BP 116/80 mmHg  Pulse 80  Temp(Src) 99.7 F (37.6 C) (Oral)  Resp 14  Ht 5\' 7"  (1.702 m)  Wt 170 lb (77.111 kg)  BMI 26.62 kg/m2  SpO2 98% GEN- NAD, alert and oriented x3 HEENT- PERRL, EOMI, non injected sclera, pink conjunctiva, MMM, oropharynx mild injection, TM clear bilat no effusion,  + maxillary sinus tenderness, inflammed turbinates,  Nasal drainage  Neck- Supple, shotty anterior  LAD CVS- RRR, no murmur RESP-CTAB EXT- No edema Pulses- Radial 2+          Assessment & Plan:      Problem List Items Addressed This Visit    None    Visit Diagnoses    Acute maxillary sinusitis, recurrence not specified    -  Primary    Most likley viral just beginning, given antibiotic as she does have yearly, she can start if she does not improve, advised Sudafed, nasal saline, fever  reducer.    Relevant Medications    amoxicillin-clavulanate (AUGMENTIN) 875-125 MG tablet    Acute URI           Note: This dictation was prepared with Dragon dictation along with smaller phrase technology. Any transcriptional errors that result from this process are unintentional.

## 2016-06-09 ENCOUNTER — Emergency Department (HOSPITAL_COMMUNITY)
Admission: EM | Admit: 2016-06-09 | Discharge: 2016-06-09 | Disposition: A | Discharge status: Home / Self Care | Payer: Self-pay | Attending: Dermatology | Admitting: Dermatology

## 2016-06-09 ENCOUNTER — Encounter (HOSPITAL_COMMUNITY): Payer: Self-pay | Admitting: Emergency Medicine

## 2016-06-09 DIAGNOSIS — Z5321 Procedure and treatment not carried out due to patient leaving prior to being seen by health care provider: Secondary | ICD-10-CM | POA: Insufficient documentation

## 2016-06-09 DIAGNOSIS — Z87891 Personal history of nicotine dependence: Secondary | ICD-10-CM | POA: Insufficient documentation

## 2016-06-09 DIAGNOSIS — N644 Mastodynia: Secondary | ICD-10-CM | POA: Insufficient documentation

## 2016-06-09 NOTE — ED Triage Notes (Signed)
Pt st's she woke up yesterday am with soreness to right breast.  St's today she felt a lump in breast and area has become more painful.

## 2016-06-13 ENCOUNTER — Encounter: Payer: Self-pay | Admitting: Family Medicine

## 2016-06-13 ENCOUNTER — Ambulatory Visit (INDEPENDENT_AMBULATORY_CARE_PROVIDER_SITE_OTHER): Payer: BLUE CROSS/BLUE SHIELD | Admitting: Family Medicine

## 2016-06-13 VITALS — BP 122/84 | HR 84 | Temp 98.2°F | Resp 16 | Ht 67.0 in | Wt 165.0 lb

## 2016-06-13 DIAGNOSIS — N63 Unspecified lump in breast: Secondary | ICD-10-CM | POA: Diagnosis not present

## 2016-06-13 DIAGNOSIS — N6310 Unspecified lump in the right breast, unspecified quadrant: Secondary | ICD-10-CM

## 2016-06-13 NOTE — Progress Notes (Signed)
Subjective:    Patient ID: Alison GongHeather M Mayer, female    DOB: 05/09/1976, 40 y.o.   MRN: [id number]  HPI  Patient is a very pleasant 40 year old white female here today reporting right breast pain. Pain began approximately 48 hours ago. There is a palpable lump at 9:00 on the right breast from the nipple. It radiates out from the nipple in the pattern of a duct. It is extremely tender to touch. Family history is significant for a maternal grandmother who had breast cancer in her 30s. Honestly this as the patient concerned Past Medical History:  Diagnosis Date  . Dyslipidemia   . Endometriosis    Past Surgical History:  Procedure Laterality Date  . ABDOMINAL HYSTERECTOMY    . LAPAROSCOPY    . TUBAL LIGATION     Current Outpatient Prescriptions on File Prior to Visit  Medication Sig Dispense Refill  . estradiol (ESTRACE) 1 MG tablet Take 1 tablet (1 mg total) by mouth at bedtime. 30 tablet 11   No current facility-administered medications on file prior to visit.    No Known Allergies Social History   Social History  . Marital status: Married    Spouse name: N/A  . Number of children: N/A  . Years of education: N/A   Occupational History  . Not on file.   Social History Main Topics  . Smoking status: Former Smoker    Quit date: 02/28/2014  . Smokeless tobacco: Never Used  . Alcohol use No  . Drug use: No  . Sexual activity: Yes    Birth control/ protection: None     Comment: Hysterectomy   Other Topics Concern  . Not on file   Social History Narrative  . No narrative on file   Family History  Problem Relation Age of Onset  . Cancer Maternal Grandmother      Review of Systems  All other systems reviewed and are negative.      Objective:   Physical Exam  Constitutional: She is oriented to person, place, and time. She appears well-developed and well-nourished. No distress.  HENT:  Head: Normocephalic and atraumatic.  Right Ear: External ear normal.  Left  Ear: External ear normal.  Nose: Nose normal.  Mouth/Throat: Oropharynx is clear and moist. No oropharyngeal exudate.  Eyes: Conjunctivae and EOM are normal. Pupils are equal, round, and reactive to light. Right eye exhibits no discharge. Left eye exhibits no discharge. No scleral icterus.  Neck: Normal range of motion. Neck supple. No JVD present. No tracheal deviation present. No thyromegaly present.  Cardiovascular: Normal rate, regular rhythm, normal heart sounds and intact distal pulses.  Exam reveals no gallop and no friction rub.   No murmur heard. Pulmonary/Chest: Effort normal and breath sounds normal. No stridor. No respiratory distress. She has no wheezes. She has no rales. She exhibits no tenderness.  Abdominal: Soft. Bowel sounds are normal. She exhibits no distension and no mass. There is no tenderness. There is no rebound and no guarding.  Musculoskeletal: Normal range of motion. She exhibits no edema or tenderness.  Lymphadenopathy:    She has no cervical adenopathy.  Neurological: She is alert and oriented to person, place, and time. She has normal reflexes. No cranial nerve deficit. She exhibits normal muscle tone. Coordination normal.  Skin: Skin is warm. No rash noted. She is not diaphoretic. No erythema. No pallor.  Psychiatric: She has a normal mood and affect. Her behavior is normal. Judgment and thought content normal.  Vitals  reviewed.         Assessment & Plan:  Breast mass, right - Plan: MM Digital Diagnostic Bilat  I believe this is likely an inflamed duct. Begin ibuprofen 800 mg 3 times a day. Begin warm compresses 3 times a day area I will schedule the patient for diagnostic mammogram and if necessary an ultrasound as soon as possible for her peace of mind

## 2016-06-14 ENCOUNTER — Telehealth: Payer: Self-pay | Admitting: Family Medicine

## 2016-06-14 NOTE — Telephone Encounter (Signed)
Ms. Alison Mayer called saying she's not heard from anyone regarding the diagnostic mammogram she's supposed to have scheduled. She's wondering if there's anyone she's to see particularly and when she can expect to hear from someone. I informed her that Selena BattenKim is out of the office today and that sometimes it can take about a week. She'd like a call regarding this.  Pt's ph# [phone] Thank you.

## 2016-06-16 NOTE — Telephone Encounter (Signed)
[facility] left her message

## 2016-06-17 ENCOUNTER — Other Ambulatory Visit: Payer: Self-pay | Admitting: Family Medicine

## 2016-06-17 ENCOUNTER — Ambulatory Visit
Admission: RE | Admit: 2016-06-17 | Discharge: 2016-06-17 | Disposition: A | Discharge status: Home / Self Care | Payer: BLUE CROSS/BLUE SHIELD | Source: Ambulatory Visit | Attending: Family Medicine | Admitting: Family Medicine

## 2016-06-17 DIAGNOSIS — N6310 Unspecified lump in the right breast, unspecified quadrant: Secondary | ICD-10-CM

## 2016-06-21 ENCOUNTER — Ambulatory Visit
Admission: RE | Admit: 2016-06-21 | Discharge: 2016-06-21 | Disposition: A | Discharge status: Home / Self Care | Payer: BLUE CROSS/BLUE SHIELD | Source: Ambulatory Visit | Attending: Family Medicine | Admitting: Family Medicine

## 2016-06-21 DIAGNOSIS — N6310 Unspecified lump in the right breast, unspecified quadrant: Secondary | ICD-10-CM

## 2016-08-13 ENCOUNTER — Other Ambulatory Visit: Payer: Self-pay | Admitting: Family Medicine

## 2016-08-18 ENCOUNTER — Other Ambulatory Visit: Payer: BLUE CROSS/BLUE SHIELD

## 2016-08-18 DIAGNOSIS — Z79890 Hormone replacement therapy (postmenopausal): Secondary | ICD-10-CM

## 2016-08-18 DIAGNOSIS — Z0000 Encounter for general adult medical examination without abnormal findings: Secondary | ICD-10-CM

## 2016-08-18 DIAGNOSIS — Z79899 Other long term (current) drug therapy: Secondary | ICD-10-CM

## 2016-08-18 LAB — CBC WITH DIFFERENTIAL/PLATELET
BASOS PCT: 1 %
Basophils Absolute: 64 cells/uL (ref 0–200)
Eosinophils Absolute: 128 cells/uL (ref 15–500)
Eosinophils Relative: 2 %
HCT: 41 % (ref 35.0–45.0)
Hemoglobin: 13.3 g/dL (ref 12.0–15.0)
LYMPHS PCT: 35 %
Lymphs Abs: 2240 cells/uL (ref 850–3900)
MCH: 29.4 pg (ref 27.0–33.0)
MCHC: 32.4 g/dL (ref 32.0–36.0)
MCV: 90.7 fL (ref 80.0–100.0)
MONO ABS: 640 {cells}/uL (ref 200–950)
MONOS PCT: 10 %
MPV: 10.6 fL (ref 7.5–12.5)
NEUTROS ABS: 3328 {cells}/uL (ref 1500–7800)
Neutrophils Relative %: 52 %
PLATELETS: 188 10*3/uL (ref 140–400)
RBC: 4.52 MIL/uL (ref 3.80–5.10)
RDW: 13.4 % (ref 11.0–15.0)
WBC: 6.4 10*3/uL (ref 3.8–10.8)

## 2016-08-18 LAB — COMPLETE METABOLIC PANEL WITH GFR
ALT: 51 U/L — AB (ref 6–29)
AST: 29 U/L (ref 10–30)
Albumin: 4.3 g/dL (ref 3.6–5.1)
Alkaline Phosphatase: 63 U/L (ref 33–115)
BILIRUBIN TOTAL: 0.5 mg/dL (ref 0.2–1.2)
BUN: 8 mg/dL (ref 7–25)
CALCIUM: 9.5 mg/dL (ref 8.6–10.2)
CHLORIDE: 107 mmol/L (ref 98–110)
CO2: 24 mmol/L (ref 20–31)
CREATININE: 0.85 mg/dL (ref 0.50–1.10)
GFR, Est African American: >89 mL/min (ref >=60)
GFR, Est Non African American: 87 mL/min (ref >=60)
Glucose, Bld: 96 mg/dL (ref 70–99)
Potassium: 4.7 mmol/L (ref 3.5–5.3)
Sodium: 139 mmol/L (ref 135–146)
TOTAL PROTEIN: 6.9 g/dL (ref 6.1–8.1)

## 2016-08-18 LAB — LIPID PANEL
CHOL/HDL RATIO: 8.3 ratio — AB (ref <=5.0)
Cholesterol: 266 mg/dL — ABNORMAL HIGH (ref 125–200)
HDL: 32 mg/dL — ABNORMAL LOW (ref >=46)
LDL Cholesterol: 196 mg/dL — ABNORMAL HIGH (ref <130)
Triglycerides: 192 mg/dL — ABNORMAL HIGH (ref <150)
VLDL: 38 mg/dL — ABNORMAL HIGH (ref <30)

## 2016-08-18 LAB — TSH: TSH: 2.27 m[IU]/L

## 2016-08-22 ENCOUNTER — Encounter: Payer: Self-pay | Admitting: Family Medicine

## 2016-08-22 ENCOUNTER — Ambulatory Visit (INDEPENDENT_AMBULATORY_CARE_PROVIDER_SITE_OTHER): Payer: BLUE CROSS/BLUE SHIELD | Admitting: Family Medicine

## 2016-08-22 VITALS — BP 120/76 | HR 80 | Temp 98.7°F | Resp 14 | Ht 67.0 in | Wt 168.0 lb

## 2016-08-22 DIAGNOSIS — Z0000 Encounter for general adult medical examination without abnormal findings: Secondary | ICD-10-CM

## 2016-08-22 NOTE — Progress Notes (Signed)
Subjective:    Patient ID: Alison Mayer, female    DOB: May 05, 1976, 40 y.o.   MRN: [id number]  HPI  Patient is a very pleasant 40 year old white female here for CPE. Past medical history is significant for a total abdominal hysterectomy/BSO. This was performed due to endometriosis in 2010.  She Recently underwent mammogram and ultrasound guided cyst aspiration of a right breast mass the returned benign. Her recent lab work as listed below: Lab on 08/18/2016  Component Date Value Ref Range Status  . Sodium 08/18/2016 139  135 - 146 mmol/L Final  . Potassium 08/18/2016 4.7  3.5 - 5.3 mmol/L Final  . Chloride 08/18/2016 107  98 - 110 mmol/L Final  . CO2 08/18/2016 24  20 - 31 mmol/L Final  . Glucose, Bld 08/18/2016 96  70 - 99 mg/dL Final  . BUN 08/18/2016 8  7 - 25 mg/dL Final  . Creat 08/18/2016 0.85  0.50 - 1.10 mg/dL Final  . Total Bilirubin 08/18/2016 0.5  0.2 - 1.2 mg/dL Final  . Alkaline Phosphatase 08/18/2016 63  33 - 115 U/L Final  . AST 08/18/2016 29  10 - 30 U/L Final  . ALT 08/18/2016 51* 6 - 29 U/L Final  . Total Protein 08/18/2016 6.9  6.1 - 8.1 g/dL Final  . Albumin 08/18/2016 4.3  3.6 - 5.1 g/dL Final  . Calcium 08/18/2016 9.5  8.6 - 10.2 mg/dL Final  . GFR, Est African American 08/18/2016 >89  >=60 mL/min Final  . GFR, Est Non African American 08/18/2016 87  >=60 mL/min Final  . TSH 08/18/2016 2.27  mIU/L Final   Comment:   Reference Range   > or = 20 Years  0.40-4.50   Pregnancy Range First trimester  0.26-2.66 Second trimester 0.55-2.73 Third trimester  0.43-2.91     . Cholesterol 08/18/2016 266* 125 - 200 mg/dL Final  . Triglycerides 08/18/2016 192* <150 mg/dL Final  . HDL 08/18/2016 32* >=46 mg/dL Final  . Total CHOL/HDL Ratio 08/18/2016 8.3* <=5.0 Ratio Final  . VLDL 08/18/2016 38* <30 mg/dL Final  . LDL Cholesterol 08/18/2016 196* <130 mg/dL Final   Comment:   Total Cholesterol/HDL Ratio:CHD Risk                        Coronary Heart Disease Risk  Table                                        Men       Women          1/2 Average Risk              3.4        3.3              Average Risk              5.0        4.4           2X Average Risk              9.6        7.1           3X Average Risk             23.4       11.0 Use the calculated Patient Ratio above and the CHD Risk  table  to determine the patient's CHD Risk.   . WBC 08/18/2016 6.4  3.8 - 10.8 K/uL Final  . RBC 08/18/2016 4.52  3.80 - 5.10 MIL/uL Final  . Hemoglobin 08/18/2016 13.3  12.0 - 15.0 g/dL Final  . HCT 08/18/2016 41.0  35.0 - 45.0 % Final  . MCV 08/18/2016 90.7  80.0 - 100.0 fL Final  . MCH 08/18/2016 29.4  27.0 - 33.0 pg Final  . MCHC 08/18/2016 32.4  32.0 - 36.0 g/dL Final  . RDW 08/18/2016 13.4  11.0 - 15.0 % Final  . Platelets 08/18/2016 188  140 - 400 K/uL Final  . MPV 08/18/2016 10.6  7.5 - 12.5 fL Final  . Neutro Abs 08/18/2016 3328  1,500 - 7,800 cells/uL Final  . Lymphs Abs 08/18/2016 2240  850 - 3,900 cells/uL Final  . Monocytes Absolute 08/18/2016 640  200 - 950 cells/uL Final  . Eosinophils Absolute 08/18/2016 128  15 - 500 cells/uL Final  . Basophils Absolute 08/18/2016 64  0 - 200 cells/uL Final  . Neutrophils Relative % 08/18/2016 52  % Final  . Lymphocytes Relative 08/18/2016 35  % Final  . Monocytes Relative 08/18/2016 10  % Final  . Eosinophils Relative 08/18/2016 2  % Final  . Basophils Relative 08/18/2016 1  % Final  . Smear Review 08/18/2016 Criteria for review not met   Final    Past Medical History:  Diagnosis Date  . Dyslipidemia   . Endometriosis    Past Surgical History:  Procedure Laterality Date  . ABDOMINAL HYSTERECTOMY    . LAPAROSCOPY    . TUBAL LIGATION     Current Outpatient Prescriptions on File Prior to Visit  Medication Sig Dispense Refill  . estradiol (ESTRACE) 1 MG tablet TAKE 1 TABLET BY MOUTH AT BEDTIME 30 tablet 11   No current facility-administered medications on file prior to visit.    No Known  Allergies Social History   Social History  . Marital status: Married    Spouse name: N/A  . Number of children: N/A  . Years of education: N/A   Occupational History  . Not on file.   Social History Main Topics  . Smoking status: Former Smoker    Quit date: 02/28/2014  . Smokeless tobacco: Never Used  . Alcohol use No  . Drug use: No  . Sexual activity: Yes    Birth control/ protection: None     Comment: Hysterectomy   Other Topics Concern  . Not on file   Social History Narrative  . No narrative on file   Family History  Problem Relation Age of Onset  . Cancer Maternal Grandmother      Review of Systems  All other systems reviewed and are negative.      Objective:   Physical Exam  Constitutional: She is oriented to person, place, and time. She appears well-developed and well-nourished. No distress.  HENT:  Head: Normocephalic and atraumatic.  Right Ear: External ear normal.  Left Ear: External ear normal.  Nose: Nose normal.  Mouth/Throat: Oropharynx is clear and moist. No oropharyngeal exudate.  Eyes: Conjunctivae and EOM are normal. Pupils are equal, round, and reactive to light. Right eye exhibits no discharge. Left eye exhibits no discharge. No scleral icterus.  Neck: Normal range of motion. Neck supple. No JVD present. No tracheal deviation present. No thyromegaly present.  Cardiovascular: Normal rate, regular rhythm, normal heart sounds and intact distal pulses.  Exam reveals no gallop and no friction  rub.   No murmur heard. Pulmonary/Chest: Effort normal and breath sounds normal. No stridor. No respiratory distress. She has no wheezes. She has no rales. She exhibits no tenderness.  Abdominal: Soft. Bowel sounds are normal. She exhibits no distension and no mass. There is no tenderness. There is no rebound and no guarding.  Musculoskeletal: Normal range of motion. She exhibits no edema or tenderness.  Lymphadenopathy:    She has no cervical adenopathy.    Neurological: She is alert and oriented to person, place, and time. She has normal reflexes. No cranial nerve deficit. She exhibits normal muscle tone. Coordination normal.  Skin: Skin is warm. No rash noted. She is not diaphoretic. No erythema. No pallor.  Psychiatric: She has a normal mood and affect. Her behavior is normal. Judgment and thought content normal.  Vitals reviewed.         Assessment & Plan:  Routine general medical examination at a health care facility  Physical exam is completely normal.  The patient's cholesterol is terrible. I recommended a low saturated fat diet and 30 minutes a day 5 days a week of aerobic exercise. I would like to recheck cholesterol in 3-6 months. If still elevated at that time, I would recommend a statin medication to address

## 2016-11-25 ENCOUNTER — Other Ambulatory Visit: Payer: Self-pay | Admitting: Family Medicine

## 2016-11-25 DIAGNOSIS — N6310 Unspecified lump in the right breast, unspecified quadrant: Secondary | ICD-10-CM

## 2016-11-25 DIAGNOSIS — N6001 Solitary cyst of right breast: Secondary | ICD-10-CM

## 2016-12-22 ENCOUNTER — Ambulatory Visit
Admission: RE | Admit: 2016-12-22 | Discharge: 2016-12-22 | Disposition: A | Discharge status: Home / Self Care | Payer: BLUE CROSS/BLUE SHIELD | Source: Ambulatory Visit | Attending: Family Medicine | Admitting: Family Medicine

## 2016-12-22 DIAGNOSIS — N6310 Unspecified lump in the right breast, unspecified quadrant: Secondary | ICD-10-CM

## 2017-02-20 ENCOUNTER — Other Ambulatory Visit: Payer: BLUE CROSS/BLUE SHIELD

## 2017-02-27 ENCOUNTER — Other Ambulatory Visit: Payer: BLUE CROSS/BLUE SHIELD

## 2017-07-05 ENCOUNTER — Ambulatory Visit (INDEPENDENT_AMBULATORY_CARE_PROVIDER_SITE_OTHER): Payer: BLUE CROSS/BLUE SHIELD | Admitting: Family Medicine

## 2017-07-05 ENCOUNTER — Encounter: Payer: Self-pay | Admitting: Family Medicine

## 2017-07-05 VITALS — BP 122/77 | HR 90 | Ht 67.0 in | Wt 162.7 lb

## 2017-07-05 DIAGNOSIS — E559 Vitamin D deficiency, unspecified: Secondary | ICD-10-CM | POA: Diagnosis not present

## 2017-07-05 DIAGNOSIS — F17200 Nicotine dependence, unspecified, uncomplicated: Secondary | ICD-10-CM | POA: Diagnosis not present

## 2017-07-05 DIAGNOSIS — Z8349 Family history of other endocrine, nutritional and metabolic diseases: Secondary | ICD-10-CM

## 2017-07-05 DIAGNOSIS — M792 Neuralgia and neuritis, unspecified: Secondary | ICD-10-CM | POA: Diagnosis not present

## 2017-07-05 DIAGNOSIS — E7800 Pure hypercholesterolemia, unspecified: Secondary | ICD-10-CM

## 2017-07-05 DIAGNOSIS — Z716 Tobacco abuse counseling: Secondary | ICD-10-CM | POA: Diagnosis not present

## 2017-07-05 DIAGNOSIS — Z832 Family history of diseases of the blood and blood-forming organs and certain disorders involving the immune mechanism: Secondary | ICD-10-CM

## 2017-07-05 DIAGNOSIS — N809 Endometriosis, unspecified: Secondary | ICD-10-CM | POA: Diagnosis not present

## 2017-07-05 DIAGNOSIS — E781 Pure hyperglyceridemia: Secondary | ICD-10-CM | POA: Diagnosis not present

## 2017-07-05 DIAGNOSIS — N6001 Solitary cyst of right breast: Secondary | ICD-10-CM | POA: Insufficient documentation

## 2017-07-05 DIAGNOSIS — E786 Lipoprotein deficiency: Secondary | ICD-10-CM

## 2017-07-05 DIAGNOSIS — Z720 Tobacco use: Secondary | ICD-10-CM | POA: Insufficient documentation

## 2017-07-05 DIAGNOSIS — Z83438 Family history of other disorder of lipoprotein metabolism and other lipidemia: Secondary | ICD-10-CM

## 2017-07-05 DIAGNOSIS — Z8249 Family history of ischemic heart disease and other diseases of the circulatory system: Secondary | ICD-10-CM

## 2017-07-05 DIAGNOSIS — M79605 Pain in left leg: Secondary | ICD-10-CM | POA: Diagnosis not present

## 2017-07-05 NOTE — Patient Instructions (Signed)

## 2017-07-05 NOTE — Progress Notes (Signed)
New patient office visit note:  Impression and Recommendations:    1. Hypertriglyceridemia   2. lower Leg pain/ cramps, anterior, left   3. Neuropathic pain- L lower leg   4. 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 Elevated LDL cholesterol level   6. Family history of mixed hyperlipidemia   7. Family history of coronary artery disease in grandfather   55. Family history of thyroid disease- mom; hypo   9. Tobacco use disorder   10. Tobacco abuse counseling   11. Vitamin D deficiency   12. Endometriosis - s/p Hysterectomy/BSO; 2010    -Explained to patient that exam is reassuring to left lower extremity.  I highly recommend patient start drinking adequate amounts of water daily.  Reviewed these amounts with patient and handouts were provided.  We will check labs to include magnesium, phosphorus, vitamin D, B12 etc.  -Explained to patients that symptoms like this can come from pinched nerve and lower back as well.  If symptoms continue to be bothersome, in the future if labs are normal, we will consider further testing.   - Red flag symptoms discussed with patient such as claudication etc, and if she develops some, to follow-up sooner than planned  -Patient is anxious about getting a complete yearly physical.  She will make an appointment for this in the near future.  She will come fasting to obtain labs at that time.  The patient was counseled, risk factors were discussed, anticipatory guidance given.   No orders of the defined types were placed in this encounter.   Orders Placed This Encounter  Procedures  . CBC with Differential/Platelet  . Comprehensive metabolic panel  . Phosphorus  . Magnesium  . Lipid panel  . Hemoglobin A1c  . Hepatitis C antibody  . HIV antibody  . TSH  . T4, free  . T3, free  . VITAMIN D 25 Hydroxy (Vit-D Deficiency, Fractures)  . Vitamin B12   Gross side effects, risk and benefits, and alternatives of medications discussed with  patient.  Patient is aware that all medications have potential side effects and we are unable to predict every side effect or drug-drug interaction that may occur.  Expresses verbal understanding and consents to current therapy plan and treatment regimen.  Return for Come in for yearly wellness exam near future.  Come fasting.  Please see AVS handed out to patient at the end of our visit for further patient instructions/ counseling done pertaining to today's office visit.    Note: This document was prepared using Dragon voice recognition software and may include unintentional dictation errors.  ----------------------------------------------------------------------------------------------------------------------    Subjective:    Chief complaint:   Chief Complaint  Patient presents with  . Establish Care     HPI: Alison Mayer is a pleasant 41 y.o. female who presents to [facility] today to review their medical history with me and establish care.   I asked the patient to review their chronic problem list with me to ensure everything was updated and accurate.    All recent office visits with other providers, any medical records that patient brought in etc  - I reviewed today.     Also asked pt to get me medical records from [facility] providers/ specialists that they had seen within the past 3-5 years- if they are in private practice and/or do not work for a [employer], [facility], [place], [facility] or [employer] owned practice.  Told them to call their specialists to clarify this if they are not sure.   Problem  Tobacco Use Disorder    - Started age 41 around 1/2 ppd since.   Quit for 6 yrs as well- when pregnant 2 times.   Cold [place].    --> Patient with 10.5 pack yr hx.  Wants to quit.  Thinks she may try again- cold [place]        - Premature menopause secondary to hysterectomy 2010 now with hot flashes  -Ever since patient recalls having very bad  cholesterol levels since she had initially checked 2015.  Doesn't remember or recall very much medical care prior to that.  At that time and note through [facility] total cholesterol 219, triglycerides 225, HDL 42, VLDL 45, LDL 132.  Recently in 08/18/2016 had total cholesterol 266, triglyceride 192, HDL 32, VLDL 38, HDL 196.  Father with HLD only- healthy otherwise.  Mom- hx of irreg heart beats and nothing else.     Wt Readings from Last 3 Encounters:  11/02/17 170 lb 3.2 oz (77.2 kg)  08/31/17 164 lb 6.4 oz (74.6 kg)  07/05/17 162 lb 11.2 oz (73.8 kg)   BP Readings from Last 3 Encounters:  04/27/18 120/78  11/02/17 102/68  08/31/17 111/77   Pulse Readings from Last 3 Encounters:  04/27/18 84  11/02/17 80  08/31/17 83   BMI Readings from Last 3 Encounters:  11/02/17 26.66 kg/m  08/31/17 25.75 kg/m  07/05/17 25.48 kg/m    Patient Care Team    Relationship Specialty Notifications Start End  Thomasene Lotpalski, Jerlyn Pain, DO PCP - General Family Medicine  07/05/17     Patient Active Problem List   Diagnosis Date Noted  . Tobacco use disorder 07/05/2017    Priority: High  . Tobacco abuse counseling 07/05/2017    Priority: High  . 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 Hypertriglyceridemia 07/05/2017    Priority: Medium  . Vitamin D deficiency 07/05/2017    Priority: Low  . Myalgia 11/02/2017  . Prediabetes 09/04/2017  . Elevated LDL cholesterol level 08/31/2017  . History of smoking 08/31/2017  . Breast cyst, right 07/05/2017  . Family history of mixed hyperlipidemia 07/05/2017  . Family history of coronary artery disease in grandfather 07/05/2017  . Family history of thyroid disease- mom; hypo 07/05/2017  . lower Leg pain, anterior, left 07/05/2017  . Neuropathic pain- L lower leg 07/05/2017  . Endometriosis - s/p Hysterectomy/BSO; 2010      Past Medical History:  Diagnosis Date  . Dyslipidemia   . Endometriosis      Past Medical  History:  Diagnosis Date  . Dyslipidemia   . Endometriosis      Past Surgical History:  Procedure Laterality Date  . ABDOMINAL HYSTERECTOMY    . BREAST CYST EXCISION    . LAPAROSCOPY    . TUBAL LIGATION       Family History  Problem Relation Age of Onset  . Hyperlipidemia Father   . Cancer Maternal Grandmother   . Breast cancer Maternal Grandmother   . Heart disease Paternal Grandfather      Social History   Substance and Sexual Activity  Drug Use No     Social History   Substance and Sexual Activity  Alcohol Use No     Social History   Tobacco Use  Smoking Status Former Smoker  . Last attempt to quit: 02/28/2014  . Years since quitting:  4.1  Smokeless Tobacco Never Used     Outpatient Encounter Medications as of 07/05/2017  Medication Sig  . Calcium Carbonate-Vit D-Min (CALCIUM 1200 PO) Take 1,200 mg by mouth daily.  . cholecalciferol (VITAMIN D) 1000 units tablet Take 2,000 Units by mouth daily.  . milk thistle 175 MG tablet Take 175 mg by mouth daily.  . MULTIPLE VITAMIN PO Take by mouth.  . Omega-3 Fatty Acids (FISH OIL) 1000 MG CAPS Take 1 capsule by mouth daily.  . vitamin E 400 UNIT capsule Take 400 Units by mouth daily.  . [DISCONTINUED] estradiol (ESTRACE) 1 MG tablet TAKE 1 TABLET BY MOUTH AT BEDTIME  . [DISCONTINUED] Menaquinone-7 (VITAMIN K2 PO) Take 90 mcg by mouth daily.  . [DISCONTINUED] vitamin E 600 UNIT capsule Take 800 Units by mouth daily.   No facility-administered encounter medications on file as of 07/05/2017.     Allergies: Patient has no known allergies.   Review of Systems  Constitutional: Negative for chills, diaphoresis, fever, malaise/fatigue and weight loss.  HENT: Negative for congestion, sore throat and tinnitus.   Eyes: Negative for blurred vision, double vision and photophobia.  Respiratory: Negative for cough and wheezing.   Cardiovascular: Negative for chest pain and palpitations.  Gastrointestinal: Negative for  blood in stool, diarrhea, nausea and vomiting.  Genitourinary: Negative for dysuria, frequency and urgency.  Musculoskeletal: Negative for joint pain and myalgias.  Skin: Negative for itching and rash.  Neurological: Negative for dizziness, focal weakness, weakness and headaches.  Endo/Heme/Allergies: Negative for environmental allergies and polydipsia. Does not bruise/bleed easily.  Psychiatric/Behavioral: Negative for depression and memory loss. The patient is not nervous/anxious and does not have insomnia.      Objective:   Blood pressure 122/77, pulse 90, height 5\' 7"  (1.702 m), weight 162 lb 11.2 oz (73.8 kg). Body mass index is 25.48 kg/m. General: Well Developed, well nourished, and in no acute distress.  Neuro: Alert and oriented x3, extra-ocular muscles intact, sensation grossly intact.  HEENT:/AT, PERRLA, neck supple, No carotid bruits Skin: no gross rashes  Cardiac: Regular rate and rhythm Respiratory: Essentially clear to auscultation bilaterally. Not using accessory muscles, speaking in full sentences.  Abdominal: not grossly distended Musculoskeletal: Ambulates w/o diff, FROM * 4 ext. No calf or leg tenderness, no swelling, ecchymosis, no pitting edema.  Neurovascular intact distally, and equal bilaterally. Vasc: less 2 sec cap RF, warm and pink  Psych:  No HI/SI, judgement and insight good, Euthymic mood. Full Affect.

## 2017-08-31 ENCOUNTER — Ambulatory Visit (INDEPENDENT_AMBULATORY_CARE_PROVIDER_SITE_OTHER): Payer: BLUE CROSS/BLUE SHIELD | Admitting: Family Medicine

## 2017-08-31 ENCOUNTER — Encounter: Payer: Self-pay | Admitting: Family Medicine

## 2017-08-31 VITALS — BP 111/77 | HR 83 | Ht 67.0 in | Wt 164.4 lb

## 2017-08-31 DIAGNOSIS — Z87891 Personal history of nicotine dependence: Secondary | ICD-10-CM | POA: Diagnosis not present

## 2017-08-31 DIAGNOSIS — F17200 Nicotine dependence, unspecified, uncomplicated: Secondary | ICD-10-CM | POA: Diagnosis not present

## 2017-08-31 DIAGNOSIS — E786 Lipoprotein deficiency: Secondary | ICD-10-CM

## 2017-08-31 DIAGNOSIS — M79605 Pain in left leg: Secondary | ICD-10-CM

## 2017-08-31 DIAGNOSIS — Z8249 Family history of ischemic heart disease and other diseases of the circulatory system: Secondary | ICD-10-CM | POA: Diagnosis not present

## 2017-08-31 DIAGNOSIS — Z0000 Encounter for general adult medical examination without abnormal findings: Secondary | ICD-10-CM | POA: Diagnosis not present

## 2017-08-31 DIAGNOSIS — E781 Pure hyperglyceridemia: Secondary | ICD-10-CM

## 2017-08-31 DIAGNOSIS — E7800 Pure hypercholesterolemia, unspecified: Secondary | ICD-10-CM | POA: Diagnosis not present

## 2017-08-31 DIAGNOSIS — Z8349 Family history of other endocrine, nutritional and metabolic diseases: Secondary | ICD-10-CM | POA: Diagnosis not present

## 2017-08-31 DIAGNOSIS — Z716 Tobacco abuse counseling: Secondary | ICD-10-CM

## 2017-08-31 DIAGNOSIS — Z83438 Family history of other disorder of lipoprotein metabolism and other lipidemia: Secondary | ICD-10-CM | POA: Diagnosis not present

## 2017-08-31 DIAGNOSIS — Z719 Counseling, unspecified: Secondary | ICD-10-CM | POA: Diagnosis not present

## 2017-08-31 DIAGNOSIS — E559 Vitamin D deficiency, unspecified: Secondary | ICD-10-CM

## 2017-08-31 DIAGNOSIS — M792 Neuralgia and neuritis, unspecified: Secondary | ICD-10-CM

## 2017-08-31 NOTE — Patient Instructions (Addendum)
Congrats on the smoking cessation!  Continue to ween nicotine in your vapor    Preventive Care for Adults, Female  A healthy lifestyle and preventive care can promote health and wellness. Preventive health guidelines for women include the following key practices.   A routine yearly physical is a good way to check with your health care provider about your health and preventive screening. It is a chance to share any concerns and updates on your health and to receive a thorough exam.   Visit your dentist for a routine exam and preventive care every 6 months. Brush your teeth twice a day and floss once a day. Good oral hygiene prevents tooth decay and gum disease.   The frequency of eye exams is based on your age, health, family medical history, use of contact lenses, and other factors. Follow your health care provider's recommendations for frequency of eye exams.   Eat a healthy diet. Foods like vegetables, fruits, whole grains, low-fat dairy products, and lean protein foods contain the nutrients you need without too many calories. Decrease your intake of foods high in solid fats, added sugars, and salt. Eat the right amount of calories for you.Get information about a proper diet from your health care provider, if necessary.   Regular physical exercise is one of the most important things you can do for your health. Most adults should get at least 150 minutes of moderate-intensity exercise (any activity that increases your heart rate and causes you to sweat) each week. In addition, most adults need muscle-strengthening exercises on 2 or more days a week.   Maintain a healthy weight. The body mass index (BMI) is a screening tool to identify possible weight problems. It provides an estimate of body fat based on height and weight. Your health care provider can find your BMI, and can help you achieve or maintain a healthy weight.For adults 20 years and older:   - A BMI below 18.5 is considered  underweight.   - A BMI of 18.5 to 24.9 is normal.   - A BMI of 25 to 29.9 is considered overweight.   - A BMI of 30 and above is considered obese.   Maintain normal blood lipids and cholesterol levels by exercising and minimizing your intake of trans and saturated fats.  Eat a balanced diet with plenty of fruit and vegetables. Blood tests for lipids and cholesterol should begin at age 66 and be repeated every 5 years minimum.  If your lipid or cholesterol levels are high, you are over 40, or you are at high risk for heart disease, you may need your cholesterol levels checked more frequently.Ongoing high lipid and cholesterol levels should be treated with medicines if diet and exercise are not working.   If you smoke, find out from your health care provider how to quit. If you do not use tobacco, do not start.   Lung cancer screening is recommended for adults aged 56-80 years who are at high risk for developing lung cancer because of a history of smoking. A yearly low-dose CT scan of the lungs is recommended for people who have at least a 30-pack-year history of smoking and are a current smoker or have quit within the past 15 years. A pack year of smoking is smoking an average of 1 pack of cigarettes a day for 1 year (for example: 1 pack a day for 30 years or 2 packs a day for 15 years). Yearly screening should continue until the smoker has stopped  smoking for at least 15 years. Yearly screening should be stopped for people who develop a health problem that would prevent them from having lung cancer treatment.   If you are pregnant, do not drink alcohol. If you are breastfeeding, be very cautious about drinking alcohol. If you are not pregnant and choose to drink alcohol, do not have more than 1 drink per day. One drink is considered to be 12 ounces (355 mL) of beer, 5 ounces (148 mL) of wine, or 1.5 ounces (44 mL) of liquor.   Avoid use of street drugs. Do not share needles with anyone. Ask for  help if you need support or instructions about stopping the use of drugs.   High blood pressure causes heart disease and increases the risk of stroke. Your blood pressure should be checked at least yearly.  Ongoing high blood pressure should be treated with medicines if weight loss and exercise do not work.   If you are 77-75 years old, ask your health care provider if you should take aspirin to prevent strokes.   Diabetes screening involves taking a blood sample to check your fasting blood sugar level. This should be done once every 3 years, after age 49, if you are within normal weight and without risk factors for diabetes. Testing should be considered at a younger age or be carried out more frequently if you are overweight and have at least 1 risk factor for diabetes.   Breast cancer screening is essential preventive care for women. You should practice "breast self-awareness."  This means understanding the normal appearance and feel of your breasts and may include breast self-examination.  Any changes detected, no matter how small, should be reported to a health care provider.  Women in their 5s and 30s should have a clinical breast exam (CBE) by a health care provider as part of a regular health exam every 1 to 3 years.  After age 41, women should have a CBE every year.  Starting at age 21, women should consider having a mammogram (breast X-ray test) every year.  Women who have a family history of breast cancer should talk to their health care provider about genetic screening.  Women at a high risk of breast cancer should talk to their health care providers about having an MRI and a mammogram every year.   -Breast cancer gene (BRCA)-related cancer risk assessment is recommended for women who have family members with BRCA-related cancers. BRCA-related cancers include breast, ovarian, tubal, and peritoneal cancers. Having family members with these cancers may be associated with an increased risk for  harmful changes (mutations) in the breast cancer genes BRCA1 and BRCA2. Results of the assessment will determine the need for genetic counseling and BRCA1 and BRCA2 testing.   The Pap test is a screening test for cervical cancer. A Pap test can show cell changes on the cervix that might become cervical cancer if left untreated. A Pap test is a procedure in which cells are obtained and examined from the lower end of the uterus (cervix).   - Women should have a Pap test starting at age 68.   - Between ages 33 and 49, Pap tests should be repeated every 2 years.   - Beginning at age 27, you should have a Pap test every 3 years as long as the past 3 Pap tests have been normal.   - Some women have medical problems that increase the chance of getting cervical cancer. Talk to your health care provider  about these problems. It is especially important to talk to your health care provider if a new problem develops soon after your last Pap test. In these cases, your health care provider may recommend more frequent screening and Pap tests.   - The above recommendations are the same for women who have or have not gotten the vaccine for human papillomavirus (HPV).   - If you had a hysterectomy for a problem that was not cancer or a condition that could lead to cancer, then you no longer need Pap tests. Even if you no longer need a Pap test, a regular exam is a good idea to make sure no other problems are starting.   - If you are between ages 61 and 7 years, and you have had normal Pap tests going back 10 years, you no longer need Pap tests. Even if you no longer need a Pap test, a regular exam is a good idea to make sure no other problems are starting.   - If you have had past treatment for cervical cancer or a condition that could lead to cancer, you need Pap tests and screening for cancer for at least 20 years after your treatment.   - If Pap tests have been discontinued, risk factors (such as a new sexual  partner) need to be reassessed to determine if screening should be resumed.   - The HPV test is an additional test that may be used for cervical cancer screening. The HPV test looks for the virus that can cause the cell changes on the cervix. The cells collected during the Pap test can be tested for HPV. The HPV test could be used to screen women aged 82 years and older, and should be used in women of any age who have unclear Pap test results. After the age of 70, women should have HPV testing at the same frequency as a Pap test.   Colorectal cancer can be detected and often prevented. Most routine colorectal cancer screening begins at the age of 86 years and continues through age 29 years. However, your health care provider may recommend screening at an earlier age if you have risk factors for colon cancer. On a yearly basis, your health care provider may provide home test kits to check for hidden blood in the stool.  Use of a small camera at the end of a tube, to directly examine the colon (sigmoidoscopy or colonoscopy), can detect the earliest forms of colorectal cancer. Talk to your health care provider about this at age 76, when routine screening begins. Direct exam of the colon should be repeated every 5 -10 years through age 56 years, unless early forms of pre-cancerous polyps or small growths are found.   People who are at an increased risk for hepatitis B should be screened for this virus. You are considered at high risk for hepatitis B if:  -You were born in a country where hepatitis B occurs often. Talk with your health care provider about which countries are considered high risk.  - Your parents were born in a high-risk country and you have not received a shot to protect against hepatitis B (hepatitis B vaccine).  - You have HIV or AIDS.  - You use needles to inject street drugs.  - You live with, or have sex with, someone who has Hepatitis B.  - You get hemodialysis treatment.  - You  take certain medicines for conditions like cancer, organ transplantation, and autoimmune conditions.   Hepatitis  C blood testing is recommended for all people born from [birth year] through 1963 and any individual with known risks for hepatitis C.   Practice safe sex. Use condoms and avoid high-risk sexual practices to reduce the spread of sexually transmitted infections (STIs). STIs include gonorrhea, chlamydia, syphilis, trichomonas, herpes, HPV, and human immunodeficiency virus (HIV). Herpes, HIV, and HPV are viral illnesses that have no cure. They can result in disability, cancer, and death. Sexually active women aged 81 years and younger should be checked for chlamydia. Older women with new or multiple partners should also be tested for chlamydia. Testing for other STIs is recommended if you are sexually active and at increased risk.   Osteoporosis is a disease in which the bones lose minerals and strength with aging. This can result in serious bone fractures or breaks. The risk of osteoporosis can be identified using a bone density scan. Women ages 30 years and over and women at risk for fractures or osteoporosis should discuss screening with their health care providers. Ask your health care provider whether you should take a calcium supplement or vitamin D to There are also several preventive steps women can take to avoid osteoporosis and resulting fractures or to keep osteoporosis from worsening. -->Recommendations include:  Eat a balanced diet high in fruits, vegetables, calcium, and vitamins.  Get enough calcium. The recommended total intake of is 1,200 mg daily; for best absorption, if taking supplements, divide doses into 250-500 mg doses throughout the day. Of the two types of calcium, calcium carbonate is best absorbed when taken with food but calcium citrate can be taken on an empty stomach.  Get enough vitamin D. NAMS and the [place] recommend at least 1,000 IU per  day for women age 47 and over who are at risk of vitamin D deficiency. Vitamin D deficiency can be caused by inadequate sun exposure (for example, those who live in [place]).  Avoid alcohol and smoking. Heavy alcohol intake (more than 7 drinks per week) increases the risk of falls and hip fracture and women smokers tend to lose bone more rapidly and have lower bone mass than nonsmokers. Stopping smoking is one of the most important changes women can make to improve their health and decrease risk for disease.  Be physically active every day. Weight-bearing exercise (for example, fast walking, hiking, jogging, and weight training) may strengthen bones or slow the rate of bone loss that comes with aging. Balancing and muscle-strengthening exercises can reduce the risk of falling and fracture.  Consider therapeutic medications. Currently, several types of effective drugs are available. Healthcare providers can recommend the type most appropriate for each woman.  Eliminate environmental factors that may contribute to accidents. Falls cause nearly 90% of all osteoporotic fractures, so reducing this risk is an important bone-health strategy. Measures include ample lighting, removing obstructions to walking, using nonskid rugs on floors, and placing mats and/or grab bars in showers.  Be aware of medication side effects. Some common medicines make bones weaker. These include a type of steroid drug called glucocorticoids used for arthritis and asthma, some antiseizure drugs, certain sleeping pills, treatments for endometriosis, and some cancer drugs. An overactive thyroid gland or using too much thyroid hormone for an underactive thyroid can also be a problem. If you are taking these medicines, talk to your doctor about what you can do to help protect your bones.reduce the rate of osteoporosis.    Menopause can be associated with physical symptoms and risks. Hormone replacement therapy  is available to  decrease symptoms and risks. You should talk to your health care provider about whether hormone replacement therapy is right for you.   Use sunscreen. Apply sunscreen liberally and repeatedly throughout the day. You should seek shade when your shadow is shorter than you. Protect yourself by wearing long sleeves, pants, a wide-brimmed hat, and sunglasses year round, whenever you are outdoors.   Once a month, do a whole body skin exam, using a mirror to look at the skin on your back. Tell your health care provider of new moles, moles that have irregular borders, moles that are larger than a pencil eraser, or moles that have changed in shape or color.   -Stay current with required vaccines (immunizations).   Influenza vaccine. All adults should be immunized every year.  Tetanus, diphtheria, and acellular pertussis (Td, Tdap) vaccine. Pregnant women should receive 1 dose of Tdap vaccine during each pregnancy. The dose should be obtained regardless of the length of time since the last dose. Immunization is preferred during the 27th 36th week of gestation. An adult who has not previously received Tdap or who does not know her vaccine status should receive 1 dose of Tdap. This initial dose should be followed by tetanus and diphtheria toxoids (Td) booster doses every 10 years. Adults with an unknown or incomplete history of completing a 3-dose immunization series with Td-containing vaccines should begin or complete a primary immunization series including a Tdap dose. Adults should receive a Td booster every 10 years.  Varicella vaccine. An adult without evidence of immunity to varicella should receive 2 doses or a second dose if she has previously received 1 dose. Pregnant females who do not have evidence of immunity should receive the first dose after pregnancy. This first dose should be obtained before leaving the health care facility. The second dose should be obtained 4 8 weeks after the first  dose.  Human papillomavirus (HPV) vaccine. Females aged 27 26 years who have not received the vaccine previously should obtain the 3-dose series. The vaccine is not recommended for use in pregnant females. However, pregnancy testing is not needed before receiving a dose. If a female is found to be pregnant after receiving a dose, no treatment is needed. In that case, the remaining doses should be delayed until after the pregnancy. Immunization is recommended for any person with an immunocompromised condition through the age of 66 years if she did not get any or all doses earlier. During the 3-dose series, the second dose should be obtained 4 8 weeks after the first dose. The third dose should be obtained 24 weeks after the first dose and 16 weeks after the second dose.  Zoster vaccine. One dose is recommended for adults aged 72 years or older unless certain conditions are present.  Measles, mumps, and rubella (MMR) vaccine. Adults born before 80 generally are considered immune to measles and mumps. Adults born in [birth year] or later should have 1 or more doses of MMR vaccine unless there is a contraindication to the vaccine or there is laboratory evidence of immunity to each of the three diseases. A routine second dose of MMR vaccine should be obtained at least 28 days after the first dose for students attending postsecondary schools, health care workers, or international travelers. People who received inactivated measles vaccine or an unknown type of measles vaccine during 1963 1967 should receive 2 doses of MMR vaccine. People who received inactivated mumps vaccine or an unknown type of mumps vaccine before  1979 and are at high risk for mumps infection should consider immunization with 2 doses of MMR vaccine. For females of childbearing age, rubella immunity should be determined. If there is no evidence of immunity, females who are not pregnant should be vaccinated. If there is no evidence of immunity, females  who are pregnant should delay immunization until after pregnancy. Unvaccinated health care workers born before [birth year] who lack laboratory evidence of measles, mumps, or rubella immunity or laboratory confirmation of disease should consider measles and mumps immunization with 2 doses of MMR vaccine or rubella immunization with 1 dose of MMR vaccine.  Pneumococcal 13-valent conjugate (PCV13) vaccine. When indicated, a person who is uncertain of her immunization history and has no record of immunization should receive the PCV13 vaccine. An adult aged 19 years or older who has certain medical conditions and has not been previously immunized should receive 1 dose of PCV13 vaccine. This PCV13 should be followed with a dose of pneumococcal polysaccharide (PPSV23) vaccine. The PPSV23 vaccine dose should be obtained at least 8 weeks after the dose of PCV13 vaccine. An adult aged 4 years or older who has certain medical conditions and previously received 1 or more doses of PPSV23 vaccine should receive 1 dose of PCV13. The PCV13 vaccine dose should be obtained 1 or more years after the last PPSV23 vaccine dose.  Pneumococcal polysaccharide (PPSV23) vaccine. When PCV13 is also indicated, PCV13 should be obtained first. All adults aged 31 years and older should be immunized. An adult younger than age 9 years who has certain medical conditions should be immunized. Any person who resides in a nursing home or long-term care facility should be immunized. An adult smoker should be immunized. People with an immunocompromised condition and certain other conditions should receive both PCV13 and PPSV23 vaccines. People with human immunodeficiency virus (HIV) infection should be immunized as soon as possible after diagnosis. Immunization during chemotherapy or radiation therapy should be avoided. Routine use of PPSV23 vaccine is not recommended for American Indians, [place] Natives, or people younger than 65 years unless there are  medical conditions that require PPSV23 vaccine. When indicated, people who have unknown immunization and have no record of immunization should receive PPSV23 vaccine. One-time revaccination 5 years after the first dose of PPSV23 is recommended for people aged 40 64 years who have chronic kidney failure, nephrotic syndrome, asplenia, or immunocompromised conditions. People who received 1 2 doses of PPSV23 before age 70 years should receive another dose of PPSV23 vaccine at age 59 years or later if at least 5 years have passed since the previous dose. Doses of PPSV23 are not needed for people immunized with PPSV23 at or after age 57 years.  Meningococcal vaccine. Adults with asplenia or persistent complement component deficiencies should receive 2 doses of quadrivalent meningococcal conjugate (MenACWY-D) vaccine. The doses should be obtained at least 2 months apart. Microbiologists working with certain meningococcal bacteria, [employer] recruits, people at risk during an outbreak, and people who travel to or live in countries with a high rate of meningitis should be immunized. A first-year college student up through age 37 years who is living in a residence hall should receive a dose if she did not receive a dose on or after her 16th birthday. Adults who have certain high-risk conditions should receive one or more doses of vaccine.  Hepatitis A vaccine. Adults who wish to be protected from this disease, have certain high-risk conditions, work with hepatitis A-infected animals, work in hepatitis A research labs,  or travel to or work in countries with a high rate of hepatitis A should be immunized. Adults who were previously unvaccinated and who anticipate close contact with an international adoptee during the first 60 days after arrival in the [place] from a country with a high rate of hepatitis A should be immunized.  Hepatitis B vaccine.  Adults who wish to be protected from this disease, have certain  high-risk conditions, may be exposed to blood or other infectious body fluids, are household contacts or sex partners of hepatitis B positive people, are clients or workers in certain care facilities, or travel to or work in countries with a high rate of hepatitis B should be immunized.  Haemophilus influenzae type b (Hib) vaccine. A previously unvaccinated person with asplenia or sickle cell disease or having a scheduled splenectomy should receive 1 dose of Hib vaccine. Regardless of previous immunization, a recipient of a hematopoietic stem cell transplant should receive a 3-dose series 6 12 months after her successful transplant. Hib vaccine is not recommended for adults with HIV infection.  Preventive Services / Frequency Ages 15 to 39years  Blood pressure check.** / Every 1 to 2 years.  Lipid and cholesterol check.** / Every 5 years beginning at age 48.  Clinical breast exam.** / Every 3 years for women in their 79s and 24s.  BRCA-related cancer risk assessment.** / For women who have family members with a BRCA-related cancer (breast, ovarian, tubal, or peritoneal cancers).  Pap test.** / Every 2 years from ages 98 through 55. Every 3 years starting at age 56 through age 52 or 63 with a history of 3 consecutive normal Pap tests.  HPV screening.** / Every 3 years from ages 11 through ages 64 to 67 with a history of 3 consecutive normal Pap tests.  Hepatitis C blood test.** / For any individual with known risks for hepatitis C.  Skin self-exam. / Monthly.  Influenza vaccine. / Every year.  Tetanus, diphtheria, and acellular pertussis (Tdap, Td) vaccine.** / Consult your health care provider. Pregnant women should receive 1 dose of Tdap vaccine during each pregnancy. 1 dose of Td every 10 years.  Varicella vaccine.** / Consult your health care provider. Pregnant females who do not have evidence of immunity should receive the first dose after pregnancy.  HPV vaccine. / 3 doses over 6  months, if 10 and younger. The vaccine is not recommended for use in pregnant females. However, pregnancy testing is not needed before receiving a dose.  Measles, mumps, rubella (MMR) vaccine.** / You need at least 1 dose of MMR if you were born in 1957 or later. You may also need a 2nd dose. For females of childbearing age, rubella immunity should be determined. If there is no evidence of immunity, females who are not pregnant should be vaccinated. If there is no evidence of immunity, females who are pregnant should delay immunization until after pregnancy.  Pneumococcal 13-valent conjugate (PCV13) vaccine.** / Consult your health care provider.  Pneumococcal polysaccharide (PPSV23) vaccine.** / 1 to 2 doses if you smoke cigarettes or if you have certain conditions.  Meningococcal vaccine.** / 1 dose if you are age 37 to 47 years and a Market researcher living in a residence hall, or have one of several medical conditions, you need to get vaccinated against meningococcal disease. You may also need additional booster doses.  Hepatitis A vaccine.** / Consult your health care provider.  Hepatitis B vaccine.** / Consult your health care provider.  Haemophilus  influenzae type b (Hib) vaccine.** / Consult your health care provider.  Ages 17 to 64years  Blood pressure check.** / Every 1 to 2 years.  Lipid and cholesterol check.** / Every 5 years beginning at age 59 years.  Lung cancer screening. / Every year if you are aged 48 80 years and have a 30-pack-year history of smoking and currently smoke or have quit within the past 15 years. Yearly screening is stopped once you have quit smoking for at least 15 years or develop a health problem that would prevent you from having lung cancer treatment.  Clinical breast exam.** / Every year after age 58 years.  BRCA-related cancer risk assessment.** / For women who have family members with a BRCA-related cancer (breast, ovarian, tubal, or  peritoneal cancers).  Mammogram.** / Every year beginning at age 29 years and continuing for as long as you are in good health. Consult with your health care provider.  Pap test.** / Every 3 years starting at age 57 years through age 66 or 18 years with a history of 3 consecutive normal Pap tests.  HPV screening.** / Every 3 years from ages 51 years through ages 36 to 57 years with a history of 3 consecutive normal Pap tests.  Fecal occult blood test (FOBT) of stool. / Every year beginning at age 54 years and continuing until age 13 years. You may not need to do this test if you get a colonoscopy every 10 years.  Flexible sigmoidoscopy or colonoscopy.** / Every 5 years for a flexible sigmoidoscopy or every 10 years for a colonoscopy beginning at age 60 years and continuing until age 11 years.  Hepatitis C blood test.** / For all people born from [birth year] through 1963 and any individual with known risks for hepatitis C.  Skin self-exam. / Monthly.  Influenza vaccine. / Every year.  Tetanus, diphtheria, and acellular pertussis (Tdap/Td) vaccine.** / Consult your health care provider. Pregnant women should receive 1 dose of Tdap vaccine during each pregnancy. 1 dose of Td every 10 years.  Varicella vaccine.** / Consult your health care provider. Pregnant females who do not have evidence of immunity should receive the first dose after pregnancy.  Zoster vaccine.** / 1 dose for adults aged 59 years or older.  Measles, mumps, rubella (MMR) vaccine.** / You need at least 1 dose of MMR if you were born in 1957 or later. You may also need a 2nd dose. For females of childbearing age, rubella immunity should be determined. If there is no evidence of immunity, females who are not pregnant should be vaccinated. If there is no evidence of immunity, females who are pregnant should delay immunization until after pregnancy.  Pneumococcal 13-valent conjugate (PCV13) vaccine.** / Consult your health care  provider.  Pneumococcal polysaccharide (PPSV23) vaccine.** / 1 to 2 doses if you smoke cigarettes or if you have certain conditions.  Meningococcal vaccine.** / Consult your health care provider.  Hepatitis A vaccine.** / Consult your health care provider.  Hepatitis B vaccine.** / Consult your health care provider.  Haemophilus influenzae type b (Hib) vaccine.** / Consult your health care provider.  Ages 66 years and over  Blood pressure check.** / Every 1 to 2 years.  Lipid and cholesterol check.** / Every 5 years beginning at age 47 years.  Lung cancer screening. / Every year if you are aged 44 80 years and have a 30-pack-year history of smoking and currently smoke or have quit within the past 15 years. Yearly screening is  stopped once you have quit smoking for at least 15 years or develop a health problem that would prevent you from having lung cancer treatment.  Clinical breast exam.** / Every year after age 71 years.  BRCA-related cancer risk assessment.** / For women who have family members with a BRCA-related cancer (breast, ovarian, tubal, or peritoneal cancers).  Mammogram.** / Every year beginning at age 17 years and continuing for as long as you are in good health. Consult with your health care provider.  Pap test.** / Every 3 years starting at age 69 years through age 92 or 61 years with 3 consecutive normal Pap tests. Testing can be stopped between 65 and 70 years with 3 consecutive normal Pap tests and no abnormal Pap or HPV tests in the past 10 years.  HPV screening.** / Every 3 years from ages 67 years through ages 11 or 59 years with a history of 3 consecutive normal Pap tests. Testing can be stopped between 65 and 70 years with 3 consecutive normal Pap tests and no abnormal Pap or HPV tests in the past 10 years.  Fecal occult blood test (FOBT) of stool. / Every year beginning at age 37 years and continuing until age 47 years. You may not need to do this test if you  get a colonoscopy every 10 years.  Flexible sigmoidoscopy or colonoscopy.** / Every 5 years for a flexible sigmoidoscopy or every 10 years for a colonoscopy beginning at age 26 years and continuing until age 54 years.  Hepatitis C blood test.** / For all people born from [birth year] through 1963 and any individual with known risks for hepatitis C.  Osteoporosis screening.** / A one-time screening for women ages 53 years and over and women at risk for fractures or osteoporosis.  Skin self-exam. / Monthly.  Influenza vaccine. / Every year.  Tetanus, diphtheria, and acellular pertussis (Tdap/Td) vaccine.** / 1 dose of Td every 10 years.  Varicella vaccine.** / Consult your health care provider.  Zoster vaccine.** / 1 dose for adults aged 72 years or older.  Pneumococcal 13-valent conjugate (PCV13) vaccine.** / Consult your health care provider.  Pneumococcal polysaccharide (PPSV23) vaccine.** / 1 dose for all adults aged 74 years and older.  Meningococcal vaccine.** / Consult your health care provider.  Hepatitis A vaccine.** / Consult your health care provider.  Hepatitis B vaccine.** / Consult your health care provider.  Haemophilus influenzae type b (Hib) vaccine.** / Consult your health care provider. ** Family history and personal history of risk and conditions may change your health care provider's recommendations. Document Released: 12/13/2001 Document Revised: 08/07/2013  [facility] Patient Information 2014 [place], [place].   EXERCISE AND DIET:  We recommended that you start or continue a regular exercise program for good health. Regular exercise means any activity that makes your heart beat faster and makes you sweat.  We recommend exercising at least 30 minutes per day at least 3 days a week, preferably 5.  We also recommend a diet low in fat and sugar / carbohydrates.  Inactivity, poor dietary choices and obesity can cause diabetes, heart attack, stroke, and kidney damage, among  others.     ALCOHOL AND SMOKING:  Women should limit their alcohol intake to no more than 7 drinks/beers/glasses of wine (combined, not each!) per week. Moderation of alcohol intake to this level decreases your risk of breast cancer and liver damage.  ( And of course, no recreational drugs are part of a healthy lifestyle.)  Also, you should not  be smoking at all or even being exposed to second hand smoke. Most people know smoking can cause cancer, and various heart and lung diseases, but did you know it also contributes to weakening of your bones?  Aging of your skin?  Yellowing of your teeth and nails?   CALCIUM AND VITAMIN D:  Adequate intake of calcium and Vitamin D are recommended.  The recommendations for exact amounts of these supplements seem to change often, but generally speaking 600 mg of calcium (either carbonate or citrate) and 800 units of Vitamin D per day seems prudent. Certain women may benefit from higher intake of Vitamin D.  If you are among these women, your doctor will have told you during your visit.     PAP SMEARS:  Pap smears, to check for cervical cancer or precancers,  have traditionally been done yearly, although recent scientific advances have shown that most women can have pap smears less often.  However, every woman still should have a physical exam from her gynecologist or primary care physician every year. It will include a breast check, inspection of the vulva and vagina to check for abnormal growths or skin changes, a visual exam of the cervix, and then an exam to evaluate the size and shape of the uterus and ovaries.  And after 39 years of age, a rectal exam is indicated to check for rectal cancers. We will also provide age appropriate advice regarding health maintenance, like when you should have certain vaccines, screening for sexually transmitted diseases, bone density testing, colonoscopy, mammograms, etc.    MAMMOGRAMS:  All women over 20 years old should have  a yearly mammogram. Many facilities now offer a "3D" mammogram, which may cost around $50 extra out of pocket. If possible,  we recommend you accept the option to have the 3D mammogram performed.  It both reduces the number of women who will be called back for extra views which then turn out to be normal, and it is better than the routine mammogram at detecting truly abnormal areas.     COLONOSCOPY:  Colonoscopy to screen for colon cancer is recommended for all women at age 103.  We know, you hate the idea of the prep.  We agree, BUT, having colon cancer and not knowing it is worse!!  Colon cancer so often starts as a polyp that can be seen and removed at colonscopy, which can quite literally save your life!  And if your first colonoscopy is normal and you have no family history of colon cancer, most women don't have to have it again for 10 years.  Once every ten years, you can do something that may end up saving your life, right?  We will be happy to help you get it scheduled when you are ready.  Be sure to check your insurance coverage so you understand how much it will cost.  It may be covered as a preventative service at no cost, but you should check your particular policy.

## 2017-08-31 NOTE — Assessment & Plan Note (Signed)
-   5+ min counseling smoking cess provided to pt regarding the detriments of smoking to help, and benefits of quitting. -We discussed emotional barriers through stressful times. -Encouraged to make a quit date.

## 2017-08-31 NOTE — Progress Notes (Signed)
Impression and Recommendations:    1. Encounter for wellness examination   2. Health education/counseling   3. Family history of mixed hyperlipidemia   4. Family history of coronary artery disease in grandfather   5. Hypertriglyceridemia   6. 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 Elevated LDL cholesterol level   9. History of smoking   10. Tobacco abuse counseling    -We will get a mammogram when you turn 42 after review of your recent mammogram results from last year.  Please see orders section below for further details of actions taken during this office visit.  Gross side effects, risk and benefits, and alternatives of medications discussed with patient.  Patient is aware that all medications have potential side effects and we are unable to predict every side effect or drug-drug interaction that may occur.  Expresses verbal understanding and consents to current therapy plan and treatment regiment.  1) Anticipatory Guidance: Discussed importance of wearing a seatbelt while driving, not texting while driving; sunscreen when outside along with yearly skin surveillance; eating a well balanced and modest diet; physical activity at least 25 minutes per day or 150 min/ week of moderate to intense activity.  2) Immunizations / Screenings / Labs:  All immunizations and screenings that patient agrees to, are up-to-date per recommendations or will be updated today.  Patient understands the needs for q 34mo dental and yearly vision screens which pt will schedule independently. Obtain CBC, CMP, HgA1c, Lipid panel, TSH and vit D when fasting if not already done recently.   3) Weight:   Discussed goal of losing even 5-10% of current body weight which would improve overall feelings of well being and improve objective health data significantly.   Improve nutrient density of diet through increasing intake of fruits and vegetables and  decreasing saturated/trans fats, white flour products and refined sugar products.   F-up preventative CPE in 1 year. F/up sooner for chronic care management as discussed and/or prn.   Please see orders placed and AVS handed out to patient at the end of our visit for further patient instructions/ counseling done pertaining to today's office visit.     Subjective:    Chief Complaint  Patient presents with  . Follow-up    HPI: Alison Mayer is a 41 y.o. female who [facility] a yearly health maintenance exam.  Health Maintenance Summary Reviewed and updated, unless pt declines services.   Colonoscopy:     (Unnecessary secondary to < 2350 or > 39 years old;  and patient is at average risk.) Tdap: Up to date Tobacco History Reviewed:   Y;  Quit cigarettes.  1 week after I saw her on 07/05/2017 she quit.  She is using a vapor pen with starting out at 6 mg nicotine now down to 3 and plans to wean.   CT scan for screening lung CA:  n/a  Alcohol:    No concerns, no use Exercise Habits:   Not meeting AHA guidelines-  Trying to walking 2d/wk 30min;  and walks a lot at work.  STD concerns:   None;  Same partner 1465yrs.  Birth control method:   hyst 2010 Lumps or breast concerns:      None; yes patient had a cyst in her breast she had a biopsy last year that September.  She had 2 mammograms in the past.  Breast Cancer Family History:      No; GM breast CA in 60's.    Health Maintenance  Topic Date Due  . PAP SMEAR  10/30/2017 (Originally 11/08/2015)  . HIV Screening  07/04/2029 (Originally 07/03/1993)  . TETANUS/TDAP  07/22/2018  . INFLUENZA VACCINE  Completed     Wt Readings from Last 3 Encounters:  08/31/17 164 lb 6.4 oz (74.6 kg)  07/05/17 162 lb 11.2 oz (73.8 kg)  08/22/16 168 lb (76.2 kg)   BP Readings from Last 3 Encounters:  08/31/17 111/77  07/05/17 122/77  08/22/16 120/76   Pulse Readings from Last 3 Encounters:  08/31/17 83    07/05/17 90  08/22/16 80     Past Medical History:  Diagnosis Date  . Dyslipidemia   . Endometriosis       Past Surgical History:  Procedure Laterality Date  . ABDOMINAL HYSTERECTOMY    . BREAST CYST EXCISION    . LAPAROSCOPY    . TUBAL LIGATION        Family History  Problem Relation Age of Onset  . Hyperlipidemia Father   . Cancer Maternal Grandmother   . Breast cancer Maternal Grandmother   . Heart disease Paternal Grandfather       History  Drug Use No  ,   History  Alcohol Use No  ,   History  Smoking Status  . Former Smoker  . Quit date: 02/28/2014  Smokeless Tobacco  . Never Used  ,   History  Sexual Activity  . Sexual activity: Yes  . Birth control/ protection: None    Comment: Hysterectomy    Current Outpatient Prescriptions on File Prior to Visit  Medication Sig Dispense Refill  . Calcium Carbonate-Vit D-Min (CALCIUM 1200 PO) Take 1,200 mg by mouth daily.    . cholecalciferol (VITAMIN D) 1000 units tablet Take 2,000 Units by mouth daily.    Marland Kitchen estradiol (ESTRACE) 1 MG tablet TAKE 1 TABLET BY MOUTH AT BEDTIME 30 tablet 11  . milk thistle 175 MG tablet Take 175 mg by mouth daily.    . MULTIPLE VITAMIN PO Take by mouth.    . Omega-3 Fatty Acids (FISH OIL) 1000 MG CAPS Take 1 capsule by mouth daily.    . vitamin E 400 UNIT capsule Take 400 Units by mouth daily.     No current facility-administered medications on file prior to visit.     Allergies: Patient has no known allergies.  Review of Systems: General:   Denies fever, chills, unexplained weight loss.  Optho/Auditory:   Denies visual changes, blurred vision/LOV Respiratory:   Denies SOB, DOE more than baseline levels.  Cardiovascular:   Denies chest pain, palpitations, new onset peripheral edema  Gastrointestinal:   Denies nausea, vomiting, diarrhea.  Genitourinary: Denies dysuria, freq/ urgency, flank pain or discharge from genitals.  Endocrine:     Denies hot or cold  intolerance, polyuria, polydipsia. Musculoskeletal:   Denies unexplained myalgias, joint swelling, unexplained arthralgias, gait problems.  Skin:  Denies rash, suspicious lesions Neurological:     Denies dizziness, unexplained weakness, numbness  Psychiatric/Behavioral:   Denies mood changes, suicidal or homicidal ideations, hallucinations    Objective:    Blood pressure 111/77, pulse 83, height 5\' 7"  (1.702 m), weight 164 lb 6.4 oz (74.6 kg). Body mass index is 25.75 kg/m. General Appearance:    Alert, cooperative, no distress, appears stated age  Head:    Normocephalic, without obvious abnormality, atraumatic  Eyes:    PERRL, conjunctiva/corneas  clear, EOM's intact, fundi    benign, both eyes  Ears:    Normal TM's and external ear canals, both ears  Nose:   Nares normal, septum midline, mucosa normal, no drainage    or sinus tenderness  Throat:   Lips w/o lesion, mucosa moist, and tongue normal; teeth and   gums normal  Neck:   Supple, symmetrical, trachea midline, no adenopathy;    thyroid:  no enlargement/tenderness/nodules; no carotid   bruit or JVD  Back:     Symmetric, no curvature, ROM normal, no CVA tenderness  Lungs:     Clear to auscultation bilaterally, respirations unlabored, no       Wh/ R/ R  Chest Wall:    No tenderness or gross deformity; normal excursion   Heart:    Regular rate and rhythm, S1 and S2 normal, no murmur, rub   or gallop  Breast Exam:    No tenderness, masses, or nipple abnormality b/l; no d/c  Abdomen:     Soft, non-tender, bowel sounds active all four quadrants, NO   G/R/R, no masses, no organomegaly  Genitalia:  Deferred- hyst w/ b/l O  Rectal:  defer  Extremities:   Extremities normal, atraumatic, no cyanosis or gross edema  Pulses:   2+ and symmetric all extremities  Skin:   Warm, dry, Skin color, texture, turgor normal, no obvious rashes or lesions Psych: No HI/SI, judgement and insight good, Euthymic mood. Full Affect.  Neurologic:    CNII-XII intact, normal strength, sensation and reflexes    Throughout

## 2017-09-01 LAB — CBC WITH DIFFERENTIAL/PLATELET
BASOS ABS: 0 10*3/uL (ref 0.0–0.2)
Basos: 1 %
EOS (ABSOLUTE): 0.1 10*3/uL (ref 0.0–0.4)
Eos: 2 %
Hematocrit: 40.1 % (ref 34.0–46.6)
Hemoglobin: 13.3 g/dL (ref 11.1–15.9)
Immature Grans (Abs): 0 10*3/uL (ref 0.0–0.1)
Immature Granulocytes: 0 %
LYMPHS ABS: 1.8 10*3/uL (ref 0.7–3.1)
Lymphs: 35 %
MCH: 29.1 pg (ref 26.6–33.0)
MCHC: 33.2 g/dL (ref 31.5–35.7)
MCV: 88 fL (ref 79–97)
MONOS ABS: 0.5 10*3/uL (ref 0.1–0.9)
Monocytes: 10 %
Neutrophils Absolute: 2.7 10*3/uL (ref 1.4–7.0)
Neutrophils: 52 %
Platelets: 199 10*3/uL (ref 150–379)
RBC: 4.57 x10E6/uL (ref 3.77–5.28)
RDW: 13.6 % (ref 12.3–15.4)
WBC: 5.1 10*3/uL (ref 3.4–10.8)

## 2017-09-01 LAB — LIPID PANEL
Chol/HDL Ratio: 7.2 ratio — ABNORMAL HIGH (ref 0.0–4.4)
Cholesterol, Total: 244 mg/dL — ABNORMAL HIGH (ref 100–199)
HDL: 34 mg/dL — AB (ref >39)
LDL Calculated: 176 mg/dL — ABNORMAL HIGH (ref 0–99)
Triglycerides: 171 mg/dL — ABNORMAL HIGH (ref 0–149)
VLDL Cholesterol Cal: 34 mg/dL (ref 5–40)

## 2017-09-01 LAB — COMPREHENSIVE METABOLIC PANEL
ALK PHOS: 109 IU/L (ref 39–117)
ALT: 17 IU/L (ref 0–32)
AST: 16 IU/L (ref 0–40)
Albumin/Globulin Ratio: 1.8 (ref 1.2–2.2)
Albumin: 4.9 g/dL (ref 3.5–5.5)
BUN/Creatinine Ratio: 8 — ABNORMAL LOW (ref 9–23)
BUN: 8 mg/dL (ref 6–20)
Bilirubin Total: 0.4 mg/dL (ref 0.0–1.2)
CO2: 22 mmol/L (ref 20–29)
CREATININE: 0.98 mg/dL (ref 0.57–1.00)
Calcium: 9.9 mg/dL (ref 8.7–10.2)
Chloride: 104 mmol/L (ref 96–106)
GFR calc Af Amer: 84 mL/min/{1.73_m2} (ref >59)
GFR calc non Af Amer: 73 mL/min/{1.73_m2} (ref >59)
GLOBULIN, TOTAL: 2.8 g/dL (ref 1.5–4.5)
GLUCOSE: 99 mg/dL (ref 65–99)
Potassium: 4.9 mmol/L (ref 3.5–5.2)
SODIUM: 142 mmol/L (ref 134–144)
Total Protein: 7.7 g/dL (ref 6.0–8.5)

## 2017-09-01 LAB — TSH: TSH: 2.71 u[IU]/mL (ref 0.450–4.500)

## 2017-09-01 LAB — VITAMIN D 25 HYDROXY (VIT D DEFICIENCY, FRACTURES): Vit D, 25-Hydroxy: 41.5 ng/mL (ref 30.0–100.0)

## 2017-09-01 LAB — VITAMIN B12: Vitamin B-12: 539 pg/mL (ref 232–1245)

## 2017-09-01 LAB — HEPATITIS C ANTIBODY

## 2017-09-01 LAB — T3, FREE: T3, Free: 3.4 pg/mL (ref 2.0–4.4)

## 2017-09-01 LAB — HEMOGLOBIN A1C
ESTIMATED AVERAGE GLUCOSE: 117 mg/dL
HEMOGLOBIN A1C: 5.7 % — AB (ref 4.8–5.6)

## 2017-09-01 LAB — MAGNESIUM: MAGNESIUM: 2.2 mg/dL (ref 1.6–2.3)

## 2017-09-01 LAB — PHOSPHORUS: PHOSPHORUS: 4.2 mg/dL (ref 2.5–4.5)

## 2017-09-01 LAB — T4, FREE: Free T4: 1.11 ng/dL (ref 0.82–1.77)

## 2017-09-01 LAB — HIV ANTIBODY (ROUTINE TESTING W REFLEX): HIV SCREEN 4TH GENERATION: NONREACTIVE

## 2017-09-04 ENCOUNTER — Encounter: Payer: Self-pay | Admitting: Family Medicine

## 2017-09-04 ENCOUNTER — Other Ambulatory Visit: Payer: Self-pay

## 2017-09-04 DIAGNOSIS — E786 Lipoprotein deficiency: Secondary | ICD-10-CM

## 2017-09-04 DIAGNOSIS — R7303 Prediabetes: Secondary | ICD-10-CM

## 2017-09-04 DIAGNOSIS — E781 Pure hyperglyceridemia: Secondary | ICD-10-CM

## 2017-09-06 ENCOUNTER — Ambulatory Visit: Payer: BLUE CROSS/BLUE SHIELD | Admitting: Family Medicine

## 2017-11-02 ENCOUNTER — Ambulatory Visit: Payer: BLUE CROSS/BLUE SHIELD | Admitting: Adult Health

## 2017-11-02 ENCOUNTER — Encounter: Payer: Self-pay | Admitting: Adult Health

## 2017-11-02 VITALS — BP 102/68 | HR 80 | Temp 98.5°F | Ht 67.0 in | Wt 170.2 lb

## 2017-11-02 DIAGNOSIS — M791 Myalgia, unspecified site: Secondary | ICD-10-CM | POA: Diagnosis not present

## 2017-11-02 LAB — POCT INFLUENZA A/B
Influenza A, POC: NEGATIVE
Influenza B, POC: NEGATIVE

## 2017-11-02 MED ORDER — PREDNISONE 20 MG PO TABS: ORAL_TABLET | ORAL | 0 refills | Status: DC

## 2017-11-02 NOTE — Patient Instructions (Addendum)
Muscle Pain, Adult Muscle pain (myalgia) may be mild or severe. In most cases, the pain lasts only a short time and it goes away without treatment. It is normal to feel some muscle pain after starting a workout program. Muscles that have not been used often will be sore at first. Muscle pain may also be caused by many other things, including:  Overuse or muscle strain, especially if you are not in shape. This is the most common cause of muscle pain.  Injury.  Bruises.  Viruses, such as the flu.  Infectious diseases.  A chronic condition that causes muscle tenderness, fatigue, and headache (fibromyalgia).  A condition, such as lupus, in which the body's disease-fighting system attacks other organs in the body (autoimmune or rheumatologic diseases).  Certain drugs, including ACE inhibitors and statins.  To diagnose the cause of your muscle pain, your health care provider will do a physical exam and ask questions about the pain and when it began. If you have not had muscle pain for very long, your health care provider may want to wait before doing much testing. If your muscle pain has lasted a long time, your health care provider may want to run tests right away. In some cases, this may include tests to rule out certain conditions or illnesses. Treatment for muscle pain depends on the cause. Home care is often enough to relieve muscle pain. Your health care provider may also prescribe anti-inflammatory medicine. Follow these instructions at home: Activity  If overuse is causing your muscle pain: ? Slow down your activities until the pain goes away. ? Do regular, gentle exercises if you are not usually active. ? Warm up before exercising. Stretch before and after exercising. This can help lower the risk of muscle pain.  Do not continue working out if the pain is very bad. Bad pain could mean that you have injured a muscle. Managing pain and discomfort   If directed, apply ice to the  sore muscle: ? Put ice in a plastic bag. ? Place a towel between your skin and the bag. ? Leave the ice on for 20 minutes, 2-3 times a day.  You may also alternate between applying ice and applying heat as told by your health care provider. To apply heat, use the heat source that your health care provider recommends, such as a moist heat pack or a heating pad. ? Place a towel between your skin and the heat source. ? Leave the heat on for 20-30 minutes. ? Remove the heat if your skin turns bright red. This is especially important if you are unable to feel pain, heat, or cold. You may have a greater risk of getting burned. Medicines  Take over-the-counter and prescription medicines only as told by your health care provider.  Do not drive or use heavy machinery while taking prescription pain medicine. Contact a health care provider if:  Your muscle pain gets worse and medicines do not help.  You have muscle pain that lasts longer than 3 days.  You have a rash or fever along with muscle pain.  You have muscle pain after a tick bite.  You have muscle pain while working out, even though you are in good physical condition.  You have redness, soreness, or swelling along with muscle pain.  You have muscle pain after starting a new medicine or changing the dose of a medicine. Get help right away if:  You have trouble breathing.  You have trouble swallowing.  You have   muscle pain along with a stiff neck, fever, and vomiting.  You have severe muscle weakness or cannot move part of your body. This information is not intended to replace advice given to you by your health care provider. Make sure you discuss any questions you have with your health care provider. Document Released: 09/08/2006 Document Revised: 05/06/2016 Document Reviewed: 03/08/2016 [employer] Interactive Patient Education  2018 [employer]   Flu test negative. Continue to push fluids/rest/vit c-2000mg /day when not  feeling well to boost your immune system. Prednisone taper as directed. Continue OTC Acetaminophen per manufacturer's instructions. If you are still not feeling well after prednisone taper, please call clinic. FEEL BETTER!

## 2017-11-02 NOTE — Progress Notes (Signed)
Subjective:    Patient ID: Alison Mayer, female    DOB: 04/10/1976, 42 y.o.   MRN: [id number]  HPI:  Ms. Alison Mayer presents with extreme fatifue, sore throat (3/10), posterior cervical neck stiffness that is described as "someone stomped on my beck" that is intermittent and worsens with movement, rated 7/10, and low grade fever (highest 99) that all started yesterday.  She has been having clear nasal drainage for weeks and denies CP/dyspnea/palpitations/dizziness/N/V/D. She has take one dose of acetaminophen at 1100 this morning. She denies being around anyone acutely ill, but states "I have been around my niece and nephew a lot over the holidays and they go to daycare".  Patient Care Team    Relationship Specialty Notifications Start End  Thomasene Lot, DO PCP - General Family Medicine  07/05/17     Patient Active Problem List   Diagnosis Date Noted  . Prediabetes 09/04/2017  . Elevated LDL cholesterol level 08/31/2017  . History of smoking 08/31/2017  . Breast cyst, right 07/05/2017  . 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 Hypertriglyceridemia 07/05/2017  . Family history of mixed hyperlipidemia 07/05/2017  . Family history of coronary artery disease in grandfather 07/05/2017  . Family history of thyroid disease- mom; hypo 07/05/2017  . Tobacco use disorder 07/05/2017  . Tobacco abuse counseling 07/05/2017  . Vitamin D deficiency 07/05/2017  . lower Leg pain, anterior, left 07/05/2017  . Neuropathic pain- L lower leg 07/05/2017  . Endometriosis - s/p Hysterectomy/BSO; 2010      Past Medical History:  Diagnosis Date  . Dyslipidemia   . Endometriosis      Past Surgical History:  Procedure Laterality Date  . ABDOMINAL HYSTERECTOMY    . BREAST CYST EXCISION    . LAPAROSCOPY    . TUBAL LIGATION       Family History  Problem Relation Age of Onset  . Hyperlipidemia Father   . Cancer Maternal Grandmother   . Breast cancer Maternal Grandmother    . Heart disease Paternal Grandfather      Social History   Substance and Sexual Activity  Drug Use No     Social History   Substance and Sexual Activity  Alcohol Use No     Social History   Tobacco Use  Smoking Status Former Smoker  . Last attempt to quit: 02/28/2014  . Years since quitting: 3.6  Smokeless Tobacco Never Used     Outpatient Encounter Medications as of 11/02/2017  Medication Sig  . Calcium Carbonate-Vit D-Min (CALCIUM 1200 PO) Take 1,200 mg by mouth daily.  . cholecalciferol (VITAMIN D) 1000 units tablet Take 2,000 Units by mouth daily.  . milk thistle 175 MG tablet Take 175 mg by mouth daily.  . MULTIPLE VITAMIN PO Take by mouth.  . Omega-3 Fatty Acids (FISH OIL) 1000 MG CAPS Take 1 capsule by mouth daily.  . vitamin E 400 UNIT capsule Take 400 Units by mouth daily.  . [DISCONTINUED] estradiol (ESTRACE) 1 MG tablet TAKE 1 TABLET BY MOUTH AT BEDTIME   No facility-administered encounter medications on file as of 11/02/2017.     Allergies: Patient has no known allergies.  Body mass index is 26.66 kg/m.  Blood pressure 102/68, pulse 80, temperature 98.5 F (36.9 C), temperature source Oral, height 5\' 7"  (1.702 m), weight 170 lb 3.2 oz (77.2 kg), SpO2 100 %.      Review of Systems  Constitutional: Positive for activity change, fatigue and fever. Negative for appetite  change, chills, diaphoresis and unexpected weight change.  HENT: Positive for congestion, rhinorrhea, sinus pain and sore throat. Negative for sinus pressure, sneezing, tinnitus, trouble swallowing and voice change.   Eyes: Negative for visual disturbance.  Respiratory: Negative for cough, chest tightness, shortness of breath, wheezing and stridor.   Cardiovascular: Negative for chest pain, palpitations and leg swelling.  Gastrointestinal: Negative for abdominal distention, abdominal pain, blood in stool, constipation, diarrhea, nausea and vomiting.  Endocrine: Negative for cold  intolerance, heat intolerance, polydipsia, polyphagia and polyuria.  Genitourinary: Negative for difficulty urinating and flank pain.  Musculoskeletal: Positive for arthralgias, neck pain and neck stiffness. Negative for back pain.  Neurological: Negative for dizziness and headaches.  Hematological: Does not bruise/bleed easily.  Psychiatric/Behavioral: Negative for self-injury, sleep disturbance and suicidal ideas. The patient is not nervous/anxious and is not hyperactive.        Objective:   Physical Exam  Constitutional: She is oriented to person, place, and time. She appears well-developed and well-nourished. She has a sickly appearance.  HENT:  Head: Normocephalic and atraumatic.  Right Ear: Hearing, external ear and ear canal normal. Tympanic membrane is not erythematous and not bulging. No decreased hearing is noted.  Left Ear: External ear normal. Tympanic membrane is not erythematous and not bulging. No decreased hearing is noted.  Nose: Nose normal. No mucosal edema.  Mouth/Throat: Uvula is midline and mucous membranes are normal.  Due to extremely exaggerated gag reflex, unable to visualize back of throat. Some post oropharyngeal erythema noted.  Eyes: Conjunctivae are normal. Pupils are equal, round, and reactive to light.  Neck: Normal range of motion. Neck supple. Muscular tenderness present. No neck rigidity. No erythema and normal range of motion present. No Brudzinski's sign and no Kernig's sign noted.  Cardiovascular: Normal rate, regular rhythm, normal heart sounds and intact distal pulses.  No murmur heard. Pulmonary/Chest: Effort normal and breath sounds normal. No respiratory distress. She has no wheezes. She has no rales. She exhibits no tenderness.  Lymphadenopathy:    She has cervical adenopathy.  Neurological: She is alert and oriented to person, place, and time.  Skin: Skin is warm and dry. No rash noted. She is not diaphoretic. No erythema. No pallor.   Psychiatric: She has a normal mood and affect. Her behavior is normal. Judgment and thought content normal.  Nursing note and vitals reviewed.         Assessment & Plan:   1. Myalgia     Myalgia Flu test negative. Continue to push fluids/rest/vit c-2000mg /day when not feeling well to boost your immune system. Prednisone taper as directed. Continue OTC Acetaminophen per manufacturer's instructions. If you are still not feeling well after prednisone taper, please call clinic.    FOLLOW-UP:  No Follow-up on file.

## 2017-11-02 NOTE — Assessment & Plan Note (Signed)
Flu test negative. Continue to push fluids/rest/vit c-2000mg /day when not feeling well to boost your immune system. Prednisone taper as directed. Continue OTC Acetaminophen per manufacturer's instructions. If you are still not feeling well after prednisone taper, please call clinic.

## 2017-12-21 ENCOUNTER — Other Ambulatory Visit: Payer: Self-pay

## 2017-12-28 ENCOUNTER — Ambulatory Visit: Payer: Self-pay | Admitting: Family Medicine

## 2018-01-08 ENCOUNTER — Ambulatory Visit: Payer: BLUE CROSS/BLUE SHIELD | Admitting: Family Medicine

## 2018-04-27 ENCOUNTER — Emergency Department (HOSPITAL_COMMUNITY)
Admission: EM | Admit: 2018-04-27 | Discharge: 2018-04-27 | Disposition: A | Discharge status: Home / Self Care | Payer: BLUE CROSS/BLUE SHIELD | Attending: Emergency Medicine | Admitting: Emergency Medicine

## 2018-04-27 DIAGNOSIS — Z87891 Personal history of nicotine dependence: Secondary | ICD-10-CM | POA: Insufficient documentation

## 2018-04-27 DIAGNOSIS — S61213A Laceration without foreign body of left middle finger without damage to nail, initial encounter: Secondary | ICD-10-CM | POA: Diagnosis not present

## 2018-04-27 DIAGNOSIS — Y939 Activity, unspecified: Secondary | ICD-10-CM | POA: Diagnosis not present

## 2018-04-27 DIAGNOSIS — S61219A Laceration without foreign body of unspecified finger without damage to nail, initial encounter: Secondary | ICD-10-CM

## 2018-04-27 DIAGNOSIS — Y929 Unspecified place or not applicable: Secondary | ICD-10-CM | POA: Insufficient documentation

## 2018-04-27 DIAGNOSIS — Z79899 Other long term (current) drug therapy: Secondary | ICD-10-CM | POA: Insufficient documentation

## 2018-04-27 DIAGNOSIS — Y999 Unspecified external cause status: Secondary | ICD-10-CM | POA: Insufficient documentation

## 2018-04-27 DIAGNOSIS — Z23 Encounter for immunization: Secondary | ICD-10-CM | POA: Diagnosis not present

## 2018-04-27 DIAGNOSIS — X58XXXA Exposure to other specified factors, initial encounter: Secondary | ICD-10-CM | POA: Insufficient documentation

## 2018-04-27 MED ORDER — NAPROXEN 500 MG PO TABS: 500.0000 mg | ORAL_TABLET | Freq: Two times a day (BID) | ORAL | 0 refills | Status: DC

## 2018-04-27 MED ORDER — LIDOCAINE HCL (PF) 1 % IJ SOLN
5.0000 mL | Freq: Once | INTRAMUSCULAR | Status: AC
  Administered 2018-04-27: 5 mL
  Filled 2018-04-27: qty 30

## 2018-04-27 MED ORDER — TETANUS-DIPHTH-ACELL PERTUSSIS 5-2.5-18.5 LF-MCG/0.5 IM SUSP
0.5000 mL | Freq: Once | INTRAMUSCULAR | Status: AC
  Administered 2018-04-27: 0.5 mL via INTRAMUSCULAR
  Filled 2018-04-27: qty 0.5

## 2018-04-27 MED ORDER — CEPHALEXIN 500 MG PO CAPS: 500.0000 mg | ORAL_CAPSULE | Freq: Four times a day (QID) | ORAL | 0 refills | Status: DC

## 2018-04-27 NOTE — ED Triage Notes (Signed)
Laceration to middle finger (left hand), occurred today after patient was trying to install a microwave. Last tetanus shot: unknown but over 10 years ago. Pain 2/10. Bleeding controlled.

## 2018-04-27 NOTE — ED Notes (Signed)
Ortho contacted regarding finger splint.

## 2018-04-27 NOTE — ED Provider Notes (Signed)
[facility]-EMERGENCY DEPT Provider Note   CSN: [id number] Arrival date & time: 04/27/18  1654     History   Chief Complaint Chief Complaint  Patient presents with  . Laceration    HPI Alison Mayer is a 42 y.o. female who presents to the ED with a laceration to the left middle finger. Patient was trying to instal a microwave today when the injury occurred. Tetanus is not up to date. Patient reports minimal pain.  HPI  Past Medical History:  Diagnosis Date  . Dyslipidemia   . Endometriosis     Patient Active Problem List   Diagnosis Date Noted  . Myalgia 11/02/2017  . Prediabetes 09/04/2017  . Elevated LDL cholesterol level 08/31/2017  . History of smoking 08/31/2017  . Breast cyst, right 07/05/2017  . 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 Hypertriglyceridemia 07/05/2017  . Family history of mixed hyperlipidemia 07/05/2017  . Family history of coronary artery disease in grandfather 07/05/2017  . Family history of thyroid disease- mom; hypo 07/05/2017  . Tobacco use disorder 07/05/2017  . Tobacco abuse counseling 07/05/2017  . Vitamin D deficiency 07/05/2017  . lower Leg pain, anterior, left 07/05/2017  . Neuropathic pain- L lower leg 07/05/2017  . Endometriosis - s/p Hysterectomy/BSO; 2010     Past Surgical History:  Procedure Laterality Date  . ABDOMINAL HYSTERECTOMY    . BREAST CYST EXCISION    . LAPAROSCOPY    . TUBAL LIGATION       OB History    Gravida  2   Para  2   Term      Preterm      AB      Living        SAB      TAB      Ectopic      Multiple      Live Births               Home Medications    Prior to Admission medications   Medication Sig Start Date End Date Taking? Authorizing Provider  Calcium Carbonate-Vit D-Min (CALCIUM 1200 PO) Take 1,200 mg by mouth daily.    [provider]  cephALEXin (KEFLEX) 500 MG capsule Take 1 capsule (500 mg total) by mouth 4  (four) times daily. 04/27/18   Janne Napoleon, NP  cholecalciferol (VITAMIN D) 1000 units tablet Take 2,000 Units by mouth daily.    [provider]  milk thistle 175 MG tablet Take 175 mg by mouth daily.    [provider]  MULTIPLE VITAMIN PO Take by mouth.    [provider]  naproxen (NAPROSYN) 500 MG tablet Take 1 tablet (500 mg total) by mouth 2 (two) times daily. 04/27/18   Janne Napoleon, NP  Omega-3 Fatty Acids (FISH OIL) 1000 MG CAPS Take 1 capsule by mouth daily.    [provider]  predniSONE (DELTASONE) 20 MG tablet 1 tab every 12 hrs for first 3 days, then once daily for 3 days 11/02/17   William Hamburger D, NP  vitamin E 400 UNIT capsule Take 400 Units by mouth daily.    [provider]    Family History Family History  Problem Relation Age of Onset  . Hyperlipidemia Father   . Cancer Maternal Grandmother   . Breast cancer Maternal Grandmother   . Heart disease Paternal Grandfather     Social History Social History   Tobacco Use  .  Smoking status: Former Smoker    Last attempt to quit: 02/28/2014    Years since quitting: 4.1  . Smokeless tobacco: Never Used  Substance Use Topics  . Alcohol use: No  . Drug use: No     Allergies   Patient has no known allergies.   Review of Systems Review of Systems  Skin: Positive for wound.  All other systems reviewed and are negative.    Physical Exam Updated Vital Signs BP 120/78   Pulse 84   Temp 97.6 F (36.4 C)   Resp 18   SpO2 100%   Physical Exam  Constitutional: She appears well-developed and well-nourished. No distress.  HENT:  Head: Normocephalic.  Eyes: EOM are normal.  Neck: Neck supple.  Cardiovascular: Normal rate.  Pulmonary/Chest: Effort normal.  Musculoskeletal:       Left hand: She exhibits tenderness and laceration. She exhibits normal range of motion and normal capillary refill. Normal sensation noted. Normal strength noted.       Hands: Laceration to  the dorsum of the left middle finger. Flexor tendon visualized with partial laceration.   Neurological: She is alert.  Skin: Skin is warm and dry.  Wound left middle finger  Psychiatric: She has a normal mood and affect.  Nursing note and vitals reviewed.    ED Treatments / Results  Labs (all labs ordered are listed, but only abnormal results are displayed) Labs Reviewed - No data to display  Radiology No results found.  Procedures .Marland KitchenLaceration Repair Date/Time: 04/27/2018 6:52 PM Performed by: Janne Napoleon, NP Authorized by: Janne Napoleon, NP   Consent:    Consent obtained:  Verbal   Consent given by:  Patient   Risks discussed:  Infection, pain, tendon damage and poor cosmetic result   Alternatives discussed:  Referral Anesthesia (see MAR for exact dosages):    Anesthesia method:  Local infiltration   Local anesthetic:  Lidocaine 1% w/o epi Laceration details:    Location:  Finger   Finger location:  L long finger   Length (cm):  3 Repair type:    Repair type:  Simple Pre-procedure details:    Preparation:  Patient was prepped and draped in usual sterile fashion Exploration:    Hemostasis achieved with:  Direct pressure   Wound exploration: entire depth of wound probed and visualized     Wound extent: tendon damage     Tendon damage location:  Upper extremity   Upper extremity tendon damage location:  Finger flexor   Tendon damage extent:  Partial transection   Tendon repair plan:  Refer for evaluation   Contaminated: no   Treatment:    Area cleansed with:  Saline   Amount of cleaning:  Extensive   Irrigation solution:  Sterile saline   Irrigation method:  Syringe Skin repair:    Repair method:  Sutures Approximation:    Approximation:  Loose Post-procedure details:    Dressing:  Antibiotic ointment, non-adherent dressing and splint for protection   Patient tolerance of procedure:  Tolerated well, no immediate complications Comments:     Tetanus  updated   (including critical care time)  Medications Ordered in ED Medications  Tdap (BOOSTRIX) injection 0.5 mL (0.5 mLs Intramuscular Given 04/27/18 1730)  lidocaine (PF) (XYLOCAINE) 1 % injection 5 mL (5 mLs Infiltration Given by Other 04/27/18 1747)   6:30 pm Consult with Dr. Mina Marble   Initial Impression / Assessment and Plan / ED Course  I have reviewed the triage vital  signs and the nursing notes. 42 y.o. female here with laceration stable for d/c with good strength and full range of motion. Discussed this case with Dr. Steele SizerWeigold and patient to f/u in the office. Will start antibiotics and NSAIDS. Splint applied.  Final Clinical Impressions(s) / ED Diagnoses   Final diagnoses:  Laceration of finger of left hand with tendon involvement, initial encounter    ED Discharge Orders        Ordered    cephALEXin (KEFLEX) 500 MG capsule  4 times daily     04/27/18 1848    naproxen (NAPROSYN) 500 MG tablet  2 times daily     04/27/18 1848       Damian Leavelleese, [place] M, NP 04/27/18 1901    Benjiman CorePickering, Nathan, MD 04/27/18 [phone]

## 2018-04-27 NOTE — Discharge Instructions (Addendum)
Someone from Dr. Ronie SpiesWeingold's office will call you to schedule follow up for your tendon laceration. Return here as needed.

## 2018-05-01 ENCOUNTER — Encounter: Payer: Self-pay | Admitting: Family Medicine

## 2018-05-01 ENCOUNTER — Ambulatory Visit (INDEPENDENT_AMBULATORY_CARE_PROVIDER_SITE_OTHER): Payer: BLUE CROSS/BLUE SHIELD | Admitting: Family Medicine

## 2018-05-01 VITALS — BP 116/77 | HR 77 | Ht 67.0 in | Wt 168.7 lb

## 2018-05-01 DIAGNOSIS — Z723 Lack of physical exercise: Secondary | ICD-10-CM | POA: Diagnosis not present

## 2018-05-01 DIAGNOSIS — R7303 Prediabetes: Secondary | ICD-10-CM | POA: Diagnosis not present

## 2018-05-01 DIAGNOSIS — Z720 Tobacco use: Secondary | ICD-10-CM | POA: Diagnosis not present

## 2018-05-01 DIAGNOSIS — E781 Pure hyperglyceridemia: Secondary | ICD-10-CM

## 2018-05-01 DIAGNOSIS — E559 Vitamin D deficiency, unspecified: Secondary | ICD-10-CM

## 2018-05-01 DIAGNOSIS — E7800 Pure hypercholesterolemia, unspecified: Secondary | ICD-10-CM | POA: Diagnosis not present

## 2018-05-01 DIAGNOSIS — Z87891 Personal history of nicotine dependence: Secondary | ICD-10-CM | POA: Diagnosis not present

## 2018-05-01 DIAGNOSIS — E786 Lipoprotein deficiency: Secondary | ICD-10-CM

## 2018-05-01 NOTE — Progress Notes (Signed)
Impression and Recommendations:    1. Elevated LDL cholesterol level   2. Hypertriglyceridemia   3. Prediabetes   4. Vitamin D deficiency   5. Nicotine abuse   6. History of smoking   7. Low level of high density lipoprotein (HDL)   8. Inactivity     1. Cholesterol - Since she quit smoking, if patient was 42, her ten year ASCVD risk would be 1.8%.  Triglycerides = 171 LDL = 176 HDL = 34  - Reviewed that the patient's cholesterol levels need improvement.  Encouraged patient to decrease intake of saturated fats and trans fats.  - Reviewed the importance of lifestyle changes and dietary modifications as a first-line of defense when it comes to self-care.  Strongly encouraged patient to engage in a regular exercise program.  - Informational handout provided today on guidelines.  2. Prediabetes - Last measurement 8 months ago was 5.7.  - Discussed dietary and lifestyle modifications as first line of defense against diabetes.  Importance of low carb/ketogenic diet discussed with patient in addition to regular exercise.   - Encouraged patient to cut sugary drinks out of her life.  Stop drinking sweet tea; replace the sugar with a sugar substitute.  Encouraged patient to wean herself off of sweet drinks slowly by starting with half-sugar half-substitute and decreasing from there.  - Handout provided today on prediabetes low-glycemic index diet.  3. BMI Counseling Explained to patient what BMI refers to, and what it means medically.    Told patient to think about it as a "medical risk stratification measurement" and how increasing BMI is associated with increasing risk/ or worsening state of various diseases such as hypertension, hyperlipidemia, diabetes, premature OA, depression etc.  American Heart Association guidelines for healthy diet, basically Mediterranean diet, and exercise guidelines of 30 minutes 5 days per week or more discussed in detail.  Health counseling  performed.  All questions answered.  4. Lifestyle & Exercise - Encouraged patient to continue weaning down with her nicotine vape.  - Advised patient to continue working toward exercising to improve health.    - Patient will strive to obtain adequate physical activity daily.  Recommended that the patient eventually strive for at least 150 minutes of moderate cardiovascular activity per week according to guidelines established by the [facility].   - Healthy dietary habits encouraged, including low-carb, and high amounts of lean protein in diet.   - Patient should also consume adequate amounts of water - half of body weight in oz of water per day.  5. Follow-Up - Advised patient to continue following up as directed about her hand injury.  - Patient is not fasting today; in need of A1c re-check and lipid panel. - Patient will return for fasting lab work at earliest convenience.  - Continue to return for regularly scheduled chronic follow-up.  Gross side effects, risk and benefits, and alternatives of medications and treatment plan in general discussed with patient.  Patient is aware that all medications have potential side effects and we are unable to predict every side effect or drug-drug interaction that may occur.   Patient will call with any questions prior to using medication if they have concerns.  Expresses verbal understanding and consents to current therapy and treatment regimen.  No barriers to understanding were identified.  Red flag symptoms and signs discussed in detail.  Patient expressed understanding regarding what to do in case of emergency\urgent symptoms  Please see AVS handed out to patient  at the end of our visit for further patient instructions/ counseling done pertaining to today's office visit.   Return for [id number];  but follow-up near future for FBW.    Note: This note was prepared with assistance of Dragon voice recognition software. Occasional wrong-word or sound-a-like  substitutions may have occurred due to the inherent limitations of voice recognition software.  This document serves as a record of services personally performed by Alison Loteborah Fontaine Hehl, DO. It was created on her behalf by Peggye FothergillKatherine Galloway, a trained medical scribe. The creation of this record is based on the scribe's personal observations and the provider's statements to them.   I have reviewed the above medical documentation for accuracy and completeness and I concur.  Alison Mayer 05/01/18 5:08 PM   ---------------------------------------------------------------------------------------------   Subjective:     HPI: Alison Mayer is a 42 y.o. female who [facility] for issues as discussed below.  Nicotine Use Patient quit smoking cigarettes around a year and a half ago, 2017.  She does continue to use a nicotine vape.  States that she started at 8 and is now down to 3.  Recent Stitches in Left Hand Patient states that she has been taking 4 antibiotics per day for her left hand, which she needs to take with food or her stomach feels upset.  Cholesterol Patient states that her cholesterol has been high for years.  Grandfather was in his 1150's when he had his heart attack.  Reports that none of her first-degree relatives had a heart attack or stroke in their 9540's or 2350's.  Since last appointment, patient hasn't made any significant lifestyle changes; she hasn't changed her diet or started exercising.  However, she did obtain a fitness tracker and sees now that she walks over 3 miles per day at work, but with no formal exercise.  Prediabetes Patient has tried her best to cut down on soft drinks, but she has one per day at work now.  Patient notes that at home, she drinks sweet tea most of the time.    Wt Readings from Last 3 Encounters:  05/01/18 168 lb 11.2 oz (76.5 kg)  11/02/17 170 lb 3.2 oz (77.2 kg)  08/31/17 164 lb 6.4  oz (74.6 kg)   BP Readings from Last 3 Encounters:  05/01/18 116/77  04/27/18 120/78  11/02/17 102/68   Pulse Readings from Last 3 Encounters:  05/01/18 77  04/27/18 84  11/02/17 80   BMI Readings from Last 3 Encounters:  05/01/18 26.42 kg/m  11/02/17 26.66 kg/m  08/31/17 25.75 kg/m     Patient Care Team    Relationship Specialty Notifications Start End  Alison Lotpalski, Marquitta Persichetti, DO PCP - General Family Medicine  07/05/17   Dairl PonderWeingold, Matthew, MD Consulting Physician Orthopedic Surgery  05/01/18    Comment: hand speciliast     Patient Active Problem List   Diagnosis Date Noted  . Prediabetes 09/04/2017    Priority: High  . Elevated LDL cholesterol level 08/31/2017    Priority: High  . Nicotine abuse 07/05/2017    Priority: High  . Tobacco abuse counseling 07/05/2017    Priority: High  . 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 Hypertriglyceridemia 07/05/2017    Priority: Medium  . Vitamin D deficiency 07/05/2017    Priority: Low  . Inactivity 05/01/2018  . Myalgia 11/02/2017  . History of smoking 08/31/2017  . Breast cyst, right 07/05/2017  .  Family history of mixed hyperlipidemia 07/05/2017  . Family history of coronary artery disease in grandfather 07/05/2017  . Family history of thyroid disease- mom; hypo 07/05/2017  . lower Leg pain, anterior, left 07/05/2017  . Neuropathic pain- L lower leg 07/05/2017  . Endometriosis - s/p Hysterectomy/BSO; 2010     Past Medical history, Surgical history, Family history, Social history, Allergies and Medications have been entered into the medical record, reviewed and changed as needed.    Current Meds  Medication Sig  . Calcium Carbonate-Vit D-Min (CALCIUM 1200 PO) Take 1,200 mg by mouth daily.  . cephALEXin (KEFLEX) 500 MG capsule Take 1 capsule (500 mg total) by mouth 4 (four) times daily.  . cholecalciferol (VITAMIN D) 1000 units tablet Take 2,000 Units by mouth daily.  . milk thistle  175 MG tablet Take 175 mg by mouth daily.  . MULTIPLE VITAMIN PO Take by mouth.  . naproxen (NAPROSYN) 500 MG tablet Take 1 tablet (500 mg total) by mouth 2 (two) times daily.  . Omega-3 Fatty Acids (FISH OIL) 1000 MG CAPS Take 1 capsule by mouth daily.  . vitamin E 400 UNIT capsule Take 400 Units by mouth daily.    Allergies:  No Known Allergies   Review of Systems:  A fourteen system review of systems was performed and found to be positive as per HPI.   Objective:   Blood pressure 116/77, pulse 77, height 5\' 7"  (1.702 m), weight 168 lb 11.2 oz (76.5 kg), SpO2 98 %. Body mass index is 26.42 kg/m. General:  Well Developed, well nourished, appropriate for stated age.  Neuro:  Alert and oriented,  extra-ocular muscles intact  HEENT:  Normocephalic, atraumatic, neck supple, no carotid bruits appreciated  Skin:  no gross rash, warm, pink. Cardiac:  RRR, S1 S2 Respiratory:  ECTA B/L and A/P, Not using accessory muscles, speaking in full sentences- unlabored. Vascular:  Ext warm, no cyanosis apprec.; cap RF less 2 sec. Psych:  No HI/SI, judgement and insight good, Euthymic mood. Full Affect.

## 2018-05-01 NOTE — Patient Instructions (Signed)
Risk factors for prediabetes and type 2 diabetes ° °Researchers don't fully understand why some people develop prediabetes and type 2 diabetes and others don't.  It's clear that certain factors increase the risk, however, including: ° °Weight. The more fatty tissue you have, the more resistant your cells become to insulin.  °Inactivity. The less active you are, the greater your risk. Physical activity helps you control your weight, uses up glucose as energy and makes your cells more sensitive to insulin.  °Family history. Your risk increases if a parent or sibling has type 2 diabetes.  °Race. Although it's unclear why, people of certain races -- including blacks, Hispanics, American Indians and Asian-Americans -- are at higher risk.  °Age. Your risk increases as you get older. This may be because you tend to exercise less, lose muscle mass and gain weight as you age. But type 2 diabetes is also increasing dramatically among children, adolescents and younger adults.  °Gestational diabetes. If you developed gestational diabetes when you were pregnant, your risk of developing prediabetes and type 2 diabetes later increases. If you gave birth to a baby weighing more than 9 pounds (4 kilograms), you're also at risk of type 2 diabetes.  °Polycystic ovary syndrome. For women, having polycystic ovary syndrome -- a common condition characterized by irregular menstrual periods, excess hair growth and obesity -- increases the risk of diabetes.  °High blood pressure. Having blood pressure over 140/90 millimeters of mercury (mm Hg) is linked to an increased risk of type 2 diabetes.  °Abnormal cholesterol and triglyceride levels. If you have low levels of high-density lipoprotein (HDL), or "good," cholesterol, your risk of type 2 diabetes is higher. Triglycerides are another type of fat carried in the blood. People with high levels of triglycerides have an increased risk of type 2 diabetes. Your doctor can let you know what your  cholesterol and triglyceride levels are. ° °A good guide to good carbs: The glycemic index °---If you have diabetes, or at risk for diabetes, you know all too well that when you eat carbohydrates, your blood sugar goes up. The total amount of carbs you consume at a meal or in a snack mostly determines what your blood sugar will do. But the food itself also plays a role. A serving of white rice has almost the same effect as eating pure table sugar -- a quick, high spike in blood sugar. A serving of lentils has a slower, smaller effect. ° °---Picking good sources of carbs can help you control your blood sugar and your weight. Even if you don't have diabetes, eating healthier carbohydrate-rich foods can help ward off a host of chronic conditions, from heart disease to various cancers to, well, diabetes. ° °---One way to choose foods is with the glycemic index (GI). This tool measures how much a food boosts blood sugar.  The glycemic index rates the effect of a specific amount of a food on blood sugar compared with the same amount of pure glucose. A food with a glycemic index of 28 boosts blood sugar only 28% as much as pure glucose. One with a GI of 95 acts like pure glucose. ° ° ° °High glycemic foods result in a quick spike in insulin and blood sugar (also known as blood glucose).  Low glycemic foods have a slower, smaller effect- these are healthier for you.  ° °Using the glycemic index °Using the glycemic index is easy: choose foods in the low GI category instead of those in the high   GI category (see below), and go easy on those in between. °Low glycemic index (GI of 55 or less): Most fruits and vegetables, beans, minimally processed grains, pasta, low-fat dairy foods, and nuts.  °Moderate glycemic index (GI 56 to 69): White and sweet potatoes, corn, white rice, couscous, breakfast cereals such as Cream of Wheat and Mini Wheats.  °High glycemic index (GI of 70 or higher): White bread, rice cakes, most crackers,  bagels, cakes, doughnuts, croissants, most packaged breakfast cereals. °You can see the values for 100 commons foods and get links to more at [email or web address].[email or web address]. ° °Swaps for lowering glycemic index  °Instead of this high-glycemic index food Eat this lower-glycemic index food  °White rice Brown rice or converted rice  °Instant oatmeal Steel-cut oats  °Cornflakes Bran flakes  °Baked potato Pasta, bulgur  °White bread Whole-grain bread  °Corn Peas or leafy greens  ° ° ° ° ° °Prediabetes Eating Plan ° °Prediabetes--also called impaired glucose tolerance or impaired fasting glucose--is a condition that causes blood sugar (blood glucose) levels to be higher than normal. Following a healthy diet can help to keep prediabetes under control. It can also help to lower the risk of type 2 diabetes and heart disease, which are increased in people who have prediabetes. Along with regular exercise, a healthy diet: °· Promotes weight loss. °· Helps to control blood sugar levels. °· Helps to improve the way that the body uses insulin. ° ° °WHAT DO I NEED TO KNOW ABOUT THIS EATING PLAN? ° °· Use the glycemic index (GI) to plan your meals. The index tells you how quickly a food will raise your blood sugar. Choose low-GI foods. These foods take a longer time to raise blood sugar. °· Pay close attention to the amount of carbohydrates in the food that you eat. Carbohydrates increase blood sugar levels. °· Keep track of how many calories you take in. Eating the right amount of calories will help you to achieve a healthy weight. Losing about 7 percent of your starting weight can help to prevent type 2 diabetes. °· You may want to follow a Mediterranean diet. This diet includes a lot of vegetables, lean meats or fish, whole grains, fruits, and healthy oils and fats. ° ° °WHAT FOODS CAN I EAT? ° °Grains °Whole grains, such as whole-wheat or whole-grain breads, crackers, cereals, and pasta. Unsweetened oatmeal. Bulgur. Barley.  Quinoa. Brown rice. Corn or whole-wheat flour tortillas or taco shells. °Vegetables °Lettuce. Spinach. Peas. Beets. Cauliflower. Cabbage. Broccoli. Carrots. Tomatoes. Squash. Eggplant. Herbs. Peppers. Onions. Cucumbers. Brussels sprouts. °Fruits °Berries. Bananas. Apples. Oranges. Grapes. Papaya. Mango. Pomegranate. Kiwi. Grapefruit. Cherries. °Meats and Other Protein Sources °Seafood. Lean meats, such as chicken and turkey or lean cuts of pork and beef. Tofu. Eggs. Nuts. Beans. °Dairy °Low-fat or fat-free dairy products, such as yogurt, cottage cheese, and cheese. °Beverages °Water. Tea. Coffee. Sugar-free or diet soda. Seltzer water. Milk. Milk alternatives, such as soy or almond milk. °Condiments °Mustard. Relish. Low-fat, low-sugar ketchup. Low-fat, low-sugar barbecue sauce. Low-fat or fat-free mayonnaise. °Sweets and Desserts °Sugar-free or low-fat pudding. Sugar-free or low-fat ice cream and other frozen treats. °Fats and Oils °Avocado. Walnuts. Olive oil. °The items listed above may not be a complete list of recommended foods or beverages. Contact your dietitian for more options.  ° ° °WHAT FOODS ARE NOT RECOMMENDED? ° °Grains °Refined white flour and flour products, such as bread, pasta, snack foods, and cereals. °Beverages °Sweetened drinks, such as sweet iced tea and soda. °Sweets and Desserts °  Baked goods, such as cake, cupcakes, pastries, cookies, and cheesecake. °The items listed above may not be a complete list of foods and beverages to avoid. Contact your dietitian for more information. °  °This information is not intended to replace advice given to you by your health care provider. Make sure you discuss any questions you have with your health care provider. °  °Document Released: 03/03/2015 Document Reviewed: 03/03/2015 °[employer] Interactive Patient Education ©2016 [employer] ° ° ° ° ° °Guidelines for a Low Cholesterol, Low Saturated Fat Diet ° ° °Fats °- Limit total intake of fats and oils. °-  Avoid butter, stick margarine, shortening, lard, palm and coconut oils. °- Limit mayonnaise, salad dressings, gravies and sauces, unless they are homemade with low-fat ingredients. °- Limit chocolate. °- Choose low-fat and nonfat products, such as low-fat mayonnaise, low-fat or non-hydrogenated peanut butter, low-fat or fat-free salad dressings and nonfat gravy. °- Use vegetable oil, such as canola or olive oil. °- Look for margarine that does not contain trans fatty acids. °- Use nuts in moderate amounts. °- Read ingredient labels carefully to determine both amount and type of fat present in foods. Limit saturated and trans fats! °- Avoid high-fat processed and convenience foods. ° °Meats and Meat Alternatives °- Choose fish, chicken, turkey and lean meats. °- Use dried beans, peas, lentils and tofu. °- Limit egg yolks to three to four per week. °- If you eat red meat, limit to no more than three servings per week and choose loin or round cuts. °- Avoid fatty meats, such as bacon, sausage, franks, luncheon meats and ribs. °- Avoid all organ meats, including liver. ° °Dairy °- Choose nonfat or low-fat milk, yogurt and cottage cheese. °- Most cheeses are high in fat. Choose cheeses made from non-fat milk, such as mozzarella and ricotta cheese. °- Choose light or fat-free cream cheese and sour cream. °- Avoid cream and sauces made with cream. ° °Fruits and Vegetables °- Eat a wide variety of fruits and vegetables. °- Use lemon juice, vinegar or "mist" olive oil on vegetables. °- Avoid adding sauces, fat or oil to vegetables. ° °Breads, Cereals and Grains °- Choose whole-grain breads, cereals, pastas and rice. °- Avoid high-fat snack foods, such as granola, cookies, pies, pastries, doughnuts and croissants. ° °Cooking Tips °- Avoid deep fried foods. °- Trim visible fat off meats and remove skin from poultry before cooking. °- Bake, broil, boil, poach or roast poultry, fish and lean meats. °- Drain and discard fat that  drains out of meat as you cook it. °- Add little or no fat to foods. °- Use vegetable oil sprays to grease pans for cooking or baking. °- Steam vegetables. °- Use herbs or no-oil marinades to flavor foods. ° ° ° ° ° °Nine ways to increase your "good" HDL cholesterol ° °High-density lipoprotein (HDL) is often referred to as the "good" cholesterol. °Having high HDL levels helps carry cholesterol from your arteries to your liver, where it can be used or excreted. ° °Having high levels of HDL also has antioxidant and anti-inflammatory effects, and is linked to a reduced risk of heart disease (1, 2). ° °Most health experts recommend minimum blood levels of 40 mg/dl in men and 50 mg/dl in women. ° °While genetics definitely play a role, there are several other factors that affect HDL levels. ° °Here are nine healthy ways to raise your "good" HDL cholesterol. ° °1. Consume olive oil ° °two pieces of salmon on a plate °  olive oil being poured into a small dish °Extra virgin olive oil may be more healthful than processed olive oils. °Olive oil is one of the healthiest fats around. ° °A large analysis of 42 studies with more than 800,000 participants found that olive oil was the only source of monounsaturated fat that seemed to reduce heart disease risk (3). ° °Research has shown that one of olive oil's heart-healthy effects is an increase in HDL cholesterol. This effect is thought to be caused by antioxidants it contains called polyphenols (4, 5, 6, 7). ° °Extra virgin olive oil has more polyphenols than more processed olive oils, although the amount can still vary among different types and brands. ° °One study gave 200 healthy young men about 2 tablespoons (25 ml) of different olive oils per day for three weeks. ° °The researchers found that participants' HDL levels increased significantly more after they consumed the olive oil with the highest polyphenol content (6). ° °In another study, when 62 older adults consumed about  4 tablespoons (50 ml) of high-polyphenol extra virgin olive oil every day for six weeks, their HDL cholesterol increased by 6.5 mg/dl, on average (7). ° °In addition to raising HDL levels, olive oil has been found to boost HDL's anti-inflammatory and antioxidant function in studies of older people and individuals with high cholesterol levels ( 7, 8, 9). ° °Whenever possible, select high-quality, certified extra virgin olive oils, which tend to be highest in polyphenols. ° °Bottom line: Extra virgin olive oil with a high polyphenol content has been shown to increase HDL levels in healthy people, the elderly and individuals with high cholesterol. ° °2. Follow a low-carb or ketogenic diet ° °Low-carb and ketogenic diets provide a number of health benefits, including weight loss and reduced blood sugar levels. ° °They have also been shown to increase HDL cholesterol in people who tend to have lower levels. ° °This includes those who are obese, insulin resistant or diabetic (10, 11, 12, 13, 14, 15, 16, 17). ° °In one study, people with type 2 diabetes were split into two groups. ° °One followed a diet consuming less than 50 grams of carbs per day. The other followed a high-carb diet. ° °Although both groups lost weight, the low-carb group's HDL cholesterol increased almost twice as much as the high-carb group's did (14). ° °In another study, obese people who followed a low-carb diet experienced an increase in HDL cholesterol of 5 mg/dl overall. ° °Meanwhile, in the same study, the participants who ate a low-fat, high-carb diet showed a decrease in HDL cholesterol (15). ° °This response may partially be due to the higher levels of fat people typically consume on low-carb diets. ° °One study in overweight women found that diets high in meat and cheese increased HDL levels by 5-8%, compared to a higher-carb diet (18). ° °What's more, in addition to raising HDL cholesterol, very-low-carb diets have been shown to decrease  triglycerides and improve several other risk factors for heart disease (13, 14, 16, 17). ° °Bottom line: Low-carb and ketogenic diets typically increase HDL cholesterol levels in people with diabetes, metabolic syndrome and obesity. ° °3. Exercise regularly ° °Being physically active is important for heart health. ° °Studies have shown that many different types of exercise are effective at raising HDL cholesterol, including strength training, high-intensity exercise and aerobic exercise (19, 20, 21, 22, 23, 24). ° °However, the biggest increases in HDL are typically seen with high-intensity exercise. ° °One small study followed women who were   living with polycystic ovary syndrome (PCOS), which is linked to a higher risk of insulin resistance. The study required them to perform high-intensity exercise three times a week. ° °The exercise led to an increase in HDL cholesterol of 8 mg/dL after 10 weeks. The women also showed improvements in other health markers, including decreased insulin resistance and improved arterial function (23). ° °In a 12-week study, overweight men who performed high-intensity exercise experienced a 10% increase in HDL cholesterol. ° °In contrast, the low-intensity exercise group showed only a 2% increase and the endurance training group experienced no change (24). ° °However, even lower-intensity exercise seems to increase HDL's anti-inflammatory and antioxidant capacities, whether or not HDL levels change (20, 21, 25). ° °Overall, high-intensity exercise such as high-intensity interval training (HIIT) and high-intensity circuit training (HICT) may boost HDL cholesterol levels the most. ° °Bottom line: Exercising several times per week can help raise HDL cholesterol and enhance its anti-inflammatory and antioxidant effects. High-intensity forms of exercise may be especially effective. ° °4. Add coconut oil to your diet ° °Studies have shown that coconut oil may reduce appetite, increase  metabolic rate and help protect brain health, among other benefits. ° °Some people may be concerned about coconut oil's effects on heart health due to its high saturated fat content. ° °However, it appears that coconut oil is actually quite heart healthy. ° °Coconut oil tends to raise HDL cholesterol more than many other types of fat. ° °In addition, it may improve the ratio of low-density-lipoprotein (LDL) cholesterol, the "bad" cholesterol, to HDL cholesterol. Improving this ratio reduces heart disease risk (26, 27, 28, 29). ° °One study examined the health effects of coconut oil on 40 women with excess belly fat. The researchers found that participants who took coconut oil daily experienced increased HDL cholesterol and a lower LDL-to-HDL ratio. ° °In contrast, the group who took soybean oil daily had a decrease in HDL cholesterol and an increase in the LDL-to-HDL ratio (29). ° °Most studies have found these health benefits occur at a dosage of about 2 tablespoons (30 ml) of coconut oil per day. It's best to incorporate this into cooking rather than eating spoonfuls of coconut oil on their own. ° °Bottom line: Consuming 2 tablespoons (30 ml) of coconut oil per day may help increase HDL cholesterol levels. ° °5. Stop smoking ° °cigarette butt °Quitting smoking can reduce the risk of heart disease and lung cancer. °Smoking increases the risk of many health problems, including heart disease and lung cancer (30). ° °One of its negative effects is a suppression of HDL cholesterol. ° °Some studies have found that quitting smoking can increase HDL levels. Indeed, one study found no significant differences in HDL levels between former smokers and people who had never smoked (31, 32, 33, 34, 35). ° °In a one-year study of more than 1,500 people, those who quit smoking had twice the increase in HDL as those who resumed smoking within the year. The number of large HDL particles also increased, which further reduced heart  disease risk (32). ° °One study followed smokers who switched from traditional cigarettes to electronic cigarettes for one year. They found that the switch was associated with an increase in HDL cholesterol of 5 mg/dl, on average (33). ° °When it comes to the effect of nicotine replacement patches on HDL levels, research results have been mixed. ° °One study found that nicotine replacement therapy led to higher HDL cholesterol. However, other research suggests that people who use nicotine   patches likely won't see increases in HDL levels until after replacement therapy is completed (34, 36). ° °Even in studies where HDL cholesterol levels didn't increase after people quit smoking, HDL function improved, resulting in less inflammation and other beneficial effects on heart health (37). ° °Bottom line: Quitting smoking can increase HDL levels, improve HDL function and help protect heart health. ° °6. Lose weight ° °When overweight and obese people lose weight, their HDL cholesterol levels usually increase. ° °What's more, this benefit seems to occur whether weight loss is achieved by calorie counting, carb restriction, intermittent fasting, weight loss surgery or a combination of diet and exercise (16, 38, 39, 40, 41, 42). ° °One study examined HDL levels in more than 3,000 overweight and obese [place] adults who followed a lifestyle modification program for one year. ° °The researchers found that losing at least 6.6 lbs (3 kg) led to an increase in HDL cholesterol of 4 mg/dl, on average (41). ° °In another study, when obese people with type 2 diabetes consumed calorie-restricted diets that provided 20-30% of calories from protein, they experienced significant increases in HDL cholesterol levels (42). ° °The key to achieving and maintaining healthy HDL cholesterol levels is choosing the type of diet that makes it easiest for you to lose weight and keep it off. ° °Bottom Line: Several methods of weight loss have been  shown to increase HDL cholesterol levels in people who are overweight or obese. ° °7. Choose purple produce ° °Consuming purple-colored fruits and vegetables is a delicious way to potentially increase HDL cholesterol. ° °Purple produce contains antioxidants known as anthocyanins. ° °Studies using anthocyanin extracts have shown that they help fight inflammation, protect your cells from damaging free radicals and may also raise HDL cholesterol levels (43, 44, 45, 46). ° °In a 24-week study of 58 people with diabetes, those who took an anthocyanin supplement twice a day experienced a 19% increase in HDL cholesterol, on average, along with other improvements in heart health markers (45). ° °In another study, when people with cholesterol issues took anthocyanin extract for 12 weeks, their HDL cholesterol levels increased by 13.7% (46). ° °Although these studies used extracts instead of foods, there are several fruits and vegetables that are very high in anthocyanins. These include eggplant, purple corn, red cabbage, blueberries, blackberries and black raspberries. ° °Bottom line: Consuming fruits and vegetables rich in anthocyanins may help increase HDL cholesterol levels. ° °8. Eat fatty fish often ° °The omega-3 fats in fatty fish provide major benefits to heart health, including a reduction in inflammation and better functioning of the cells that line your arteries (47, 48). ° °There's some research showing that eating fatty fish or taking fish oil may also help raise low levels of HDL cholesterol (49, 50, 51, 52, 53). ° °In a study of 33 heart disease patients, participants that consumed fatty fish four times per week experienced an increase in HDL cholesterol levels. The particle size of their HDL also increased (52). ° °In another study, overweight men who consumed herring five days a week for six weeks had a 5% increase in HDL cholesterol, compared with their levels after eating lean pork and chicken five days a  week (53). ° °However, there are a few studies that found no increase in HDL cholesterol in response to increased fish or omega-3 supplement intake (54, 55). ° °In addition to herring, other types of fatty fish that may help raise HDL cholesterol include salmon, sardines, mackerel and anchovies. ° °  Bottom line: Eating fatty fish several times per week may help increase HDL cholesterol levels and provide other benefits to heart health. ° °9. Avoid artificial trans fats ° °Artificial trans fats have many negative health effects due to their inflammatory properties (56, 57). ° °There are two types of trans fats. One kind occurs naturally in animal products, including full-fat dairy. ° °In contrast, the artificial trans fats found in margarines and processed foods are created by adding hydrogen to unsaturated vegetable and seed oils. These fats are also known as industrial trans fats or partially hydrogenated fats. ° °Research has shown that, in addition to increasing inflammation and contributing to several health problems, these artificial trans fats may lower HDL cholesterol levels. ° °In one study, researchers compared how people's HDL levels responded when they consumed different margarines. ° °The study found that participants' HDL cholesterol levels were 10% lower after consuming margarine containing partially hydrogenated soybean oil, compared to their levels after consuming palm oil (58). ° °Another controlled study followed 40 adults who had diets high in different types of trans fats. ° °They found that HDL cholesterol levels in women were significantly lower after they consumed the diet high in industrial trans fats, compared to the diet containing naturally occurring trans fats (59). ° °To protect heart health and keep HDL cholesterol in the healthy range, it's best to avoid artificial trans fats altogether. ° °Bottom line: Artificial trans fats have been shown to lower HDL levels and increase inflammation,  compared to other fats. ° °Take home message ° °Although your HDL cholesterol levels are partly determined by your genetics, there are many things you can do to naturally increase your own levels. ° °Fortunately, the practices that raise HDL cholesterol often provide other health benefits as well. ° °

## 2018-05-08 ENCOUNTER — Encounter: Payer: Self-pay | Admitting: Family Medicine

## 2018-05-21 ENCOUNTER — Other Ambulatory Visit: Payer: Self-pay | Admitting: Family Medicine

## 2018-05-21 DIAGNOSIS — Z1231 Encounter for screening mammogram for malignant neoplasm of breast: Secondary | ICD-10-CM

## 2018-07-06 ENCOUNTER — Encounter: Payer: Self-pay | Admitting: Family Medicine

## 2018-07-07 ENCOUNTER — Other Ambulatory Visit: Payer: Self-pay

## 2018-07-07 ENCOUNTER — Ambulatory Visit (HOSPITAL_COMMUNITY)
Admission: EM | Admit: 2018-07-07 | Discharge: 2018-07-07 | Disposition: A | Discharge status: Home / Self Care | Payer: BLUE CROSS/BLUE SHIELD | Attending: Internal Medicine | Admitting: Internal Medicine

## 2018-07-07 ENCOUNTER — Encounter (HOSPITAL_COMMUNITY): Payer: Self-pay | Admitting: *Deleted

## 2018-07-07 DIAGNOSIS — J0100 Acute maxillary sinusitis, unspecified: Secondary | ICD-10-CM

## 2018-07-07 MED ORDER — TRIAMCINOLONE ACETONIDE 40 MG/ML IJ SUSP
40.0000 mg | Freq: Once | INTRAMUSCULAR | Status: AC
  Administered 2018-07-07: 40 mg via INTRAMUSCULAR

## 2018-07-07 MED ORDER — TRIAMCINOLONE ACETONIDE 40 MG/ML IJ SUSP
INTRAMUSCULAR | Status: AC
  Filled 2018-07-07: qty 1

## 2018-07-07 MED ORDER — AMOXICILLIN-POT CLAVULANATE 875-125 MG PO TABS: 1.0000 | ORAL_TABLET | Freq: Two times a day (BID) | ORAL | 0 refills | Status: AC

## 2018-07-07 MED ORDER — GUAIFENESIN ER 600 MG PO TB12: 1200.0000 mg | ORAL_TABLET | Freq: Two times a day (BID) | ORAL | 0 refills | Status: AC | PRN

## 2018-07-07 MED ORDER — IPRATROPIUM BROMIDE 0.06 % NA SOLN: 2.0000 | Freq: Four times a day (QID) | NASAL | 12 refills | Status: DC

## 2018-07-07 MED ORDER — FLUTICASONE PROPIONATE 50 MCG/ACT NA SUSP: 1.0000 | Freq: Every day | NASAL | 2 refills | Status: DC

## 2018-07-07 NOTE — ED Triage Notes (Signed)
Pt c/o nasal congestion, cough, nasal drainage, sore throat, and mild left ear pain that started Wednesday.

## 2018-07-07 NOTE — ED Provider Notes (Signed)
[facility]    CSN: [id number] Arrival date & time: 07/07/18  1006     History   Chief Complaint Chief Complaint  Patient presents with  . URI    HPI Alison Mayer is a 42 y.o. female.   Alison Mayer presents with complaints of sinus pressure, congestion, facial pain which is causing poor sleep, which started 9/3. Originally with cough, which has improved. No gi/gu complaints. No fevers. Mild left ear pressure. No sore throat. No known ill contacts. Has been using saline spray and allergy medication which has not helped. States gets a sinus infection every year typically. Hx of endometriosis, hypertriglyceridemia, prediabetes.     ROS per HPI.      Past Medical History:  Diagnosis Date  . Dyslipidemia   . Endometriosis     Patient Active Problem List   Diagnosis Date Noted  . Inactivity 05/01/2018  . Myalgia 11/02/2017  . Prediabetes 09/04/2017  . Elevated LDL cholesterol level 08/31/2017  . History of smoking 08/31/2017  . Breast cyst, right 07/05/2017  . 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 Hypertriglyceridemia 07/05/2017  . Family history of mixed hyperlipidemia 07/05/2017  . Family history of coronary artery disease in grandfather 07/05/2017  . Family history of thyroid disease- mom; hypo 07/05/2017  . Nicotine abuse 07/05/2017  . Tobacco abuse counseling 07/05/2017  . Vitamin D deficiency 07/05/2017  . lower Leg pain, anterior, left 07/05/2017  . Neuropathic pain- L lower leg 07/05/2017  . Endometriosis - s/p Hysterectomy/BSO; 2010     Past Surgical History:  Procedure Laterality Date  . ABDOMINAL HYSTERECTOMY    . BREAST CYST EXCISION    . LAPAROSCOPY    . TUBAL LIGATION      OB History    Gravida  2   Para  2   Term      Preterm      AB      Living        SAB      TAB      Ectopic      Multiple      Live Births               Home Medications    Prior to Admission medications     Medication Sig Start Date End Date Taking? Authorizing Provider  MULTIPLE VITAMIN PO Take by mouth.   Yes [provider]  Omega-3 Fatty Acids (FISH OIL) 1000 MG CAPS Take 1 capsule by mouth daily.   Yes [provider]  amoxicillin-clavulanate (AUGMENTIN) 875-125 MG tablet Take 1 tablet by mouth every 12 (twelve) hours for 10 days. 07/12/18 07/22/18  Georgetta Haber, NP  fluticasone (FLONASE) 50 MCG/ACT nasal spray Place 1 spray into both nostrils daily. 07/07/18   Georgetta Haber, NP  guaiFENesin (MUCINEX) 600 MG 12 hr tablet Take 2 tablets (1,200 mg total) by mouth 2 (two) times daily as needed for up to 5 days. 07/07/18 07/12/18  Linus Mako B, NP  ipratropium (ATROVENT) 0.06 % nasal spray Place 2 sprays into both nostrils 4 (four) times daily. 07/07/18   Georgetta Haber, NP    Family History Family History  Problem Relation Age of Onset  . Hyperlipidemia Father   . Cancer Maternal Grandmother   . Breast cancer Maternal Grandmother   . Heart disease Paternal Grandfather     Social History Social History   Tobacco Use  . Smoking status: Former Smoker  Last attempt to quit: 02/28/2014    Years since quitting: 4.3  . Smokeless tobacco: Never Used  Substance Use Topics  . Alcohol use: No  . Drug use: No     Allergies   Patient has no known allergies.   Review of Systems Review of Systems   Physical Exam Triage Vital Signs ED Triage Vitals  Enc Vitals Group     BP 07/07/18 1031 107/74     Pulse Rate 07/07/18 1031 68     Resp 07/07/18 1031 18     Temp 07/07/18 1031 98.4 F (36.9 C)     Temp Source 07/07/18 1031 Oral     SpO2 07/07/18 1031 96 %     Weight --      Height 07/07/18 1034 5\' 7"  (1.702 m)     Head Circumference --      Peak Flow --      Pain Score 07/07/18 1033 6     Pain Loc --      Pain Edu? --      Excl. in GC? --    No data found.  Updated Vital Signs BP 107/74 (BP Location: Left Arm)   Pulse 68   Temp 98.4 F (36.9 C)  (Oral)   Resp 18   Ht 5\' 7"  (1.702 m)   SpO2 96%   BMI 26.42 kg/m    Physical Exam  Constitutional: She is oriented to person, place, and time. She appears well-developed and well-nourished. No distress.  HENT:  Head: Normocephalic and atraumatic.  Right Ear: Tympanic membrane, external ear and ear canal normal.  Left Ear: Tympanic membrane, external ear and ear canal normal.  Nose: Right sinus exhibits maxillary sinus tenderness and frontal sinus tenderness. Left sinus exhibits maxillary sinus tenderness and frontal sinus tenderness.  Mouth/Throat: Uvula is midline, oropharynx is clear and moist and mucous membranes are normal. No tonsillar exudate.  Slight effusion to left ear without redness or bulging   Eyes: Pupils are equal, round, and reactive to light. Conjunctivae and EOM are normal.  Cardiovascular: Normal rate, regular rhythm and normal heart sounds.  Pulmonary/Chest: Effort normal and breath sounds normal.  Neurological: She is alert and oriented to person, place, and time.  Skin: Skin is warm and dry.     UC Treatments / Results  Labs (all labs ordered are listed, but only abnormal results are displayed) Labs Reviewed - No data to display  EKG None  Radiology No results found.  Procedures Procedures (including critical care time)  Medications Ordered in UC Medications  triamcinolone acetonide (KENALOG-40) injection 40 mg (has no administration in time range)    Initial Impression / Assessment and Plan / UC Course  I have reviewed the triage vital signs and the nursing notes.  Pertinent labs & imaging results that were available during my care of the patient were reviewed by me and considered in my medical decision making (see chart for details).     Afebrile. Symptoms for 5 days. Some symptoms have improved. Patient agreeable to triamcinolone im here today to try to ease some of her facial pressure/congestion symptoms. To continue with flonase, nasal  saline, atrovent spray as needed for congestion symptoms. Return precautions provided. Patient verbalized understanding and agreeable to plan.    Final Clinical Impressions(s) / UC Diagnoses   Final diagnoses:  Acute maxillary sinusitis, recurrence not specified     Discharge Instructions     Push fluids to ensure adequate hydration and keep secretions thin.  Nasal saline as needed. Flonase once a day.  Ipratropium 2-4 times a day for congestion.  Mucinex as an expectorant.  May pick up antibiotics next Thursday if persistent or worsening of symptoms. If any improvement you do not need to fill this.  If symptoms worsen or do not improve in the next week to return to be seen or to follow up with your PCP.     ED Prescriptions    Medication Sig Dispense Auth. Provider   ipratropium (ATROVENT) 0.06 % nasal spray Place 2 sprays into both nostrils 4 (four) times daily. 15 mL Linus Mako B, NP   fluticasone (FLONASE) 50 MCG/ACT nasal spray Place 1 spray into both nostrils daily. 16 g Linus Mako B, NP   guaiFENesin (MUCINEX) 600 MG 12 hr tablet Take 2 tablets (1,200 mg total) by mouth 2 (two) times daily as needed for up to 5 days. 20 tablet Linus Mako B, NP   amoxicillin-clavulanate (AUGMENTIN) 875-125 MG tablet Take 1 tablet by mouth every 12 (twelve) hours for 10 days. 20 tablet Georgetta Haber, NP     Controlled Substance Prescriptions [place] Controlled Substance Registry consulted? Not Applicable   Georgetta Haber, NP 07/07/18 1053

## 2018-07-07 NOTE — Discharge Instructions (Signed)
Push fluids to ensure adequate hydration and keep secretions thin.  Nasal saline as needed. Flonase once a day.  Ipratropium 2-4 times a day for congestion.  Mucinex as an expectorant.  May pick up antibiotics next Thursday if persistent or worsening of symptoms. If any improvement you do not need to fill this.  If symptoms worsen or do not improve in the next week to return to be seen or to follow up with your PCP.

## 2018-07-09 ENCOUNTER — Ambulatory Visit
Admission: RE | Admit: 2018-07-09 | Discharge: 2018-07-09 | Disposition: A | Discharge status: Home / Self Care | Payer: BLUE CROSS/BLUE SHIELD | Source: Ambulatory Visit | Attending: Family Medicine | Admitting: Family Medicine

## 2018-07-09 DIAGNOSIS — Z1231 Encounter for screening mammogram for malignant neoplasm of breast: Secondary | ICD-10-CM

## 2018-08-08 ENCOUNTER — Encounter (HOSPITAL_COMMUNITY): Payer: Self-pay | Admitting: *Deleted

## 2018-08-08 ENCOUNTER — Emergency Department (HOSPITAL_COMMUNITY)
Admission: EM | Admit: 2018-08-08 | Discharge: 2018-08-08 | Disposition: A | Discharge status: Home / Self Care | Payer: BLUE CROSS/BLUE SHIELD | Attending: Emergency Medicine | Admitting: Emergency Medicine

## 2018-08-08 DIAGNOSIS — Z79899 Other long term (current) drug therapy: Secondary | ICD-10-CM | POA: Insufficient documentation

## 2018-08-08 DIAGNOSIS — Z87891 Personal history of nicotine dependence: Secondary | ICD-10-CM | POA: Insufficient documentation

## 2018-08-08 DIAGNOSIS — M791 Myalgia, unspecified site: Secondary | ICD-10-CM | POA: Diagnosis not present

## 2018-08-08 DIAGNOSIS — E785 Hyperlipidemia, unspecified: Secondary | ICD-10-CM | POA: Diagnosis not present

## 2018-08-08 DIAGNOSIS — R1084 Generalized abdominal pain: Secondary | ICD-10-CM | POA: Diagnosis present

## 2018-08-08 DIAGNOSIS — M7918 Myalgia, other site: Secondary | ICD-10-CM

## 2018-08-08 LAB — URINALYSIS, ROUTINE W REFLEX MICROSCOPIC
Bacteria, UA: NONE SEEN
Bilirubin Urine: NEGATIVE
GLUCOSE, UA: NEGATIVE mg/dL
Hgb urine dipstick: NEGATIVE
Ketones, ur: NEGATIVE mg/dL
Leukocytes, UA: NEGATIVE
Nitrite: NEGATIVE
PH: 8 (ref 5.0–8.0)
Protein, ur: NEGATIVE mg/dL
SPECIFIC GRAVITY, URINE: 1.014 (ref 1.005–1.030)

## 2018-08-08 LAB — CBC WITH DIFFERENTIAL/PLATELET
ABS IMMATURE GRANULOCYTES: 0.02 10*3/uL (ref 0.00–0.07)
BASOS PCT: 0 %
Basophils Absolute: 0 10*3/uL (ref 0.0–0.1)
EOS ABS: 0.1 10*3/uL (ref 0.0–0.5)
Eosinophils Relative: 1 %
HCT: 42.1 % (ref 36.0–46.0)
Hemoglobin: 13 g/dL (ref 12.0–15.0)
Immature Granulocytes: 0 %
Lymphocytes Relative: 25 %
Lymphs Abs: 2.2 10*3/uL (ref 0.7–4.0)
MCH: 27.8 pg (ref 26.0–34.0)
MCHC: 30.9 g/dL (ref 30.0–36.0)
MCV: 90.1 fL (ref 80.0–100.0)
MONO ABS: 0.7 10*3/uL (ref 0.1–1.0)
Monocytes Relative: 8 %
NEUTROS ABS: 5.8 10*3/uL (ref 1.7–7.7)
NEUTROS PCT: 66 %
PLATELETS: 244 10*3/uL (ref 150–400)
RBC: 4.67 MIL/uL (ref 3.87–5.11)
RDW: 13.2 % (ref 11.5–15.5)
WBC: 8.7 10*3/uL (ref 4.0–10.5)
nRBC: 0 % (ref 0.0–0.2)

## 2018-08-08 LAB — COMPREHENSIVE METABOLIC PANEL
ALBUMIN: 4.6 g/dL (ref 3.5–5.0)
ALT: 25 U/L (ref 0–44)
ANION GAP: 7 (ref 5–15)
AST: 18 U/L (ref 15–41)
Alkaline Phosphatase: 96 U/L (ref 38–126)
BILIRUBIN TOTAL: 0.7 mg/dL (ref 0.3–1.2)
BUN: 7 mg/dL (ref 6–20)
CO2: 26 mmol/L (ref 22–32)
Calcium: 9.7 mg/dL (ref 8.9–10.3)
Chloride: 106 mmol/L (ref 98–111)
Creatinine, Ser: 0.89 mg/dL (ref 0.44–1.00)
GFR calc Af Amer: >60 mL/min (ref >60)
GFR calc non Af Amer: >60 mL/min (ref >60)
Glucose, Bld: 108 mg/dL — ABNORMAL HIGH (ref 70–99)
POTASSIUM: 4 mmol/L (ref 3.5–5.1)
SODIUM: 139 mmol/L (ref 135–145)
TOTAL PROTEIN: 7.4 g/dL (ref 6.5–8.1)

## 2018-08-08 LAB — LIPASE, BLOOD: Lipase: 40 U/L (ref 11–51)

## 2018-08-08 MED ORDER — METHOCARBAMOL 500 MG PO TABS: 500.0000 mg | ORAL_TABLET | Freq: Two times a day (BID) | ORAL | 0 refills | Status: DC

## 2018-08-08 NOTE — ED Provider Notes (Signed)
MSE was initiated and I personally evaluated the patient and placed orders (if any) at  2:23 PM on August 08, 2018.  The patient appears stable so that the remainder of the MSE may be completed by another provider.  Patient placed in Quick Look pathway, seen and evaluated   Chief Complaint: L flank pain  HPI:   42 year old female presents to ED for left flank pain that began yesterday.  States that the pain is constant, sharp but worsens with movement, palpation.  She suffered from a fall 3 days ago but actually fell onto her right side but not her left.  She is unsure what caused this pain to begin.  She reports nausea but denies any vomiting.  Reports changes in her bowel movements including constipation for the past few days but now having looser stools.  She has a distant history of kidney stones about 20 years ago but is unsure if this feels similar.  Denies any chest pain, shortness of breath, hematuria, dysuria, fever.  ROS: flank pain (one)  Physical Exam:   Gen: No distress  Neuro: Awake and Alert  Skin: Warm    Focused Exam: TTP of L flank, LUQ, L CVA. Lungs CTAB. RRR.   Initiation of care has begun. The patient has been counseled on the process, plan, and necessity for staying for the completion/evaluation, and the remainder of the medical screening examination    Dietrich Pates, PA-C 08/08/18 1424    Arby Barrette, MD 08/09/18 [phone]

## 2018-08-08 NOTE — ED Triage Notes (Signed)
Pt in c/o left flank pain since yesterday, also nausea denies vomiting, no distress noted

## 2018-08-08 NOTE — ED Notes (Signed)
Pt states she slipped and fell in her kitchen Sunday night, fell on R side; no loc, did not hit head

## 2018-08-08 NOTE — Discharge Instructions (Addendum)
Please read attached information. If you experience any new or worsening signs or symptoms please return to the emergency room for evaluation. Please follow-up with your primary care provider or specialist as discussed. Please use medication prescribed only as directed and discontinue taking if you have any concerning signs or symptoms.   °

## 2018-08-08 NOTE — ED Notes (Signed)
Patient verbalizes understanding of discharge instructions. Opportunity for questioning and answers were provided. Pt discharged from ED. 

## 2018-08-08 NOTE — ED Provider Notes (Signed)
[facility] EMERGENCY DEPARTMENT Provider Note   CSN: [id number] Arrival date & time: 08/08/18  1355     History   Chief Complaint Chief Complaint  Patient presents with  . Flank Pain    HPI Alison Mayer is a 42 y.o. female.  HPI   42 year old female presents today with complaints of left-sided flank and side pain.  Patient notes that days ago she tripped and fell landing on her right side.  She notes very minor pain to the right lateral hip, no left-sided pain at that time.  Patient notes she woke up the next day and noticed some soreness in the left flank and back.  She denies any chest pain shortness of breath, denies any abdominal pain dysuria or pain distal to that area.  Did not attempt taking any medications prior to arrival.    Past Medical History:  Diagnosis Date  . Dyslipidemia   . Endometriosis     Patient Active Problem List   Diagnosis Date Noted  . Inactivity 05/01/2018  . Myalgia 11/02/2017  . Prediabetes 09/04/2017  . Elevated LDL cholesterol level 08/31/2017  . History of smoking 08/31/2017  . Breast cyst, right 07/05/2017  . 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 Hypertriglyceridemia 07/05/2017  . Family history of mixed hyperlipidemia 07/05/2017  . Family history of coronary artery disease in grandfather 07/05/2017  . Family history of thyroid disease- mom; hypo 07/05/2017  . Nicotine abuse 07/05/2017  . Tobacco abuse counseling 07/05/2017  . Vitamin D deficiency 07/05/2017  . lower Leg pain, anterior, left 07/05/2017  . Neuropathic pain- L lower leg 07/05/2017  . Endometriosis - s/p Hysterectomy/BSO; 2010     Past Surgical History:  Procedure Laterality Date  . ABDOMINAL HYSTERECTOMY    . BREAST CYST EXCISION    . LAPAROSCOPY    . TUBAL LIGATION       OB History    Gravida  2   Para  2   Term      Preterm      AB      Living        SAB      TAB      Ectopic      Multiple     Live Births               Home Medications    Prior to Admission medications   Medication Sig Start Date End Date Taking? Authorizing Provider  fluticasone (FLONASE) 50 MCG/ACT nasal spray Place 1 spray into both nostrils daily. 07/07/18   Linus Mako B, NP  ipratropium (ATROVENT) 0.06 % nasal spray Place 2 sprays into both nostrils 4 (four) times daily. 07/07/18   Georgetta Haber, NP  methocarbamol (ROBAXIN) 500 MG tablet Take 1 tablet (500 mg total) by mouth 2 (two) times daily. 08/08/18   Jillian Warth, Tinnie Gens, PA-C  MULTIPLE VITAMIN PO Take by mouth.    [provider]  Omega-3 Fatty Acids (FISH OIL) 1000 MG CAPS Take 1 capsule by mouth daily.    [provider]    Family History Family History  Problem Relation Age of Onset  . Hyperlipidemia Father   . Cancer Maternal Grandmother   . Breast cancer Maternal Grandmother   . Heart disease Paternal Grandfather     Social History Social History   Tobacco Use  . Smoking status: Former Smoker    Last attempt to quit: 02/28/2014    Years since  quitting: 4.4  . Smokeless tobacco: Never Used  Substance Use Topics  . Alcohol use: No  . Drug use: No     Allergies   Patient has no known allergies.   Review of Systems Review of Systems  All other systems reviewed and are negative.   Physical Exam Updated Vital Signs BP 120/70   Pulse 69   Temp 98.9 F (37.2 C) (Oral)   Resp 16   Ht 5\' 7"  (1.702 m)   Wt 70.3 kg   SpO2 98%   BMI 24.28 kg/m   Physical Exam  Constitutional: She is oriented to person, place, and time. She appears well-developed and well-nourished.  HENT:  Head: Normocephalic and atraumatic.  Eyes: Pupils are equal, round, and reactive to light. Conjunctivae are normal. Right eye exhibits no discharge. Left eye exhibits no discharge. No scleral icterus.  Neck: Normal range of motion. No JVD present. No tracheal deviation present.  Pulmonary/Chest: Effort normal. No stridor.   Abdominal:  Abdomen soft nontender, no bruising noted, minor tenderness to left flank and lateral lumbar musculature - no midline TTP  Neurological: She is alert and oriented to person, place, and time. Coordination normal.  Psychiatric: She has a normal mood and affect. Her behavior is normal. Judgment and thought content normal.  Nursing note and vitals reviewed.    ED Treatments / Results  Labs (all labs ordered are listed, but only abnormal results are displayed) Labs Reviewed  COMPREHENSIVE METABOLIC PANEL - Abnormal; Notable for the following components:      Result Value   Glucose, Bld 108 (*)    All other components within normal limits  CBC WITH DIFFERENTIAL/PLATELET  LIPASE, BLOOD  URINALYSIS, ROUTINE W REFLEX MICROSCOPIC    EKG None  Radiology No results found.  Procedures Procedures (including critical care time)  Medications Ordered in ED Medications - No data to display   Initial Impression / Assessment and Plan / ED Course  I have reviewed the triage vital signs and the nursing notes.  Pertinent labs & imaging results that were available during my care of the patient were reviewed by me and considered in my medical decision making (see chart for details).    Labs: CMP, CBC lipase  Imaging:  Consults:  Therapeutics:  Discharge Meds: robaxin   Assessment/Plan: 42 year old female presents today with likely musculoskeletal pain.  She has no abdominal tenderness, no respiratory complaints.  Patient discharged with symptomatic care strict return precautions.  She verbalized understanding and agreement to today's plan had no further questions.   Final Clinical Impressions(s) / ED Diagnoses   Final diagnoses:  Musculoskeletal pain    ED Discharge Orders         Ordered    methocarbamol (ROBAXIN) 500 MG tablet  2 times daily     08/08/18 1624           Eyvonne Mechanic, PA-C 08/08/18 1627    Derwood Kaplan, MD 08/11/18 1514

## 2019-03-18 ENCOUNTER — Other Ambulatory Visit: Payer: Self-pay

## 2019-03-18 ENCOUNTER — Emergency Department (HOSPITAL_COMMUNITY)
Admission: EM | Admit: 2019-03-18 | Discharge: 2019-03-18 | Disposition: A | Discharge status: Home / Self Care | Payer: BLUE CROSS/BLUE SHIELD | Attending: Emergency Medicine | Admitting: Emergency Medicine

## 2019-03-18 ENCOUNTER — Encounter (HOSPITAL_COMMUNITY): Payer: Self-pay | Admitting: Emergency Medicine

## 2019-03-18 DIAGNOSIS — M79622 Pain in left upper arm: Secondary | ICD-10-CM | POA: Insufficient documentation

## 2019-03-18 DIAGNOSIS — Z87891 Personal history of nicotine dependence: Secondary | ICD-10-CM | POA: Diagnosis not present

## 2019-03-18 DIAGNOSIS — Z79899 Other long term (current) drug therapy: Secondary | ICD-10-CM | POA: Insufficient documentation

## 2019-03-18 MED ORDER — KETOROLAC TROMETHAMINE 15 MG/ML IJ SOLN
15.0000 mg | Freq: Once | INTRAMUSCULAR | Status: AC
  Administered 2019-03-18: 15 mg via INTRAMUSCULAR
  Filled 2019-03-18: qty 1

## 2019-03-18 MED ORDER — METHOCARBAMOL 500 MG PO TABS: 500.0000 mg | ORAL_TABLET | Freq: Four times a day (QID) | ORAL | 0 refills | Status: DC

## 2019-03-18 MED ORDER — NAPROXEN 500 MG PO TABS: 500.0000 mg | ORAL_TABLET | Freq: Two times a day (BID) | ORAL | 0 refills | Status: DC

## 2019-03-18 NOTE — ED Notes (Signed)
E-signature pad not working, pt unable to sign that we reviewed discharge paperwork.

## 2019-03-18 NOTE — ED Notes (Addendum)
ED Provider at bedside. 

## 2019-03-18 NOTE — ED Triage Notes (Signed)
Patient is complaining of left arm pain that started yesterday when she woke up. Patient states that during the day it has gotten worse. Patient went to bed at 8pm and has not been able to get in any sleep.

## 2019-03-18 NOTE — Discharge Instructions (Signed)
Please read and follow all provided instructions.  Your diagnoses today include:  1. Left upper arm pain     Tests performed today include:  An x-ray of the affected area - does NOT show any broken bones  Vital signs. See below for your results today.   Medications prescribed:   Robaxin (methocarbamol) - muscle relaxer medication  DO NOT drive or perform any activities that require you to be awake and alert because this medicine can make you drowsy.    Naproxen - anti-inflammatory pain medication  Do not exceed 500mg  naproxen every 12 hours, take with food  You have been prescribed an anti-inflammatory medication or NSAID. Take with food. Take smallest effective dose for the shortest duration needed for your pain. Stop taking if you experience stomach pain or vomiting.   Take any prescribed medications only as directed.  Home care instructions:   Follow any educational materials contained in this packet  Follow R.I.C.E. Protocol:  R - rest your injury   I  - use ice on injury without applying directly to skin  C - compress injury with bandage or splint  E - elevate the injury as much as possible  Follow-up instructions: Please follow-up with your primary care provider or the provided orthopedic physician (bone specialist) if you continue to have significant pain in 1 week.  Return instructions:   Please return if your fingers are numb or tingling, appear gray or blue, or you have severe pain (also elevate the arm and loosen splint or wrap if you were given one)  Please return to the Emergency Department if you experience worsening symptoms.   Please return if you have any other emergent concerns.  Additional Information:  Your vital signs today were: BP (!) 134/91 (BP Location: Left Arm)    Pulse 86    Temp 99.4 F (37.4 C) (Oral)    Resp 16    Ht 5\' 7"  (1.702 m)    Wt 74.8 kg    SpO2 100%    BMI 25.84 kg/m  If your blood pressure (BP) was elevated above  135/85 this visit, please have this repeated by your doctor within one month. --------------

## 2019-03-18 NOTE — ED Provider Notes (Signed)
[facility]-EMERGENCY DEPT Provider Note   CSN: [id number] Arrival date & time: 03/18/19  0234    History   Chief Complaint Chief Complaint  Patient presents with  . Arm Pain    left arm    HPI Alison Mayer is a 43 y.o. female.     Patient presents the emergency department with complaint of left arm pain which started gradually over the past day and has worsened to the point where she cannot sleep.  Patient denies any injuries or overuse of the arm.  She has developed a severe throbbing in the left upper arm in the vicinity of the biceps muscle.  Pain is worse with movement and palpation.  She denies any distal numbness or tingling.  No color change of the skin.  She does not have any history of neck problems or pinched nerves.  She denies any weakness or difficulties with grasping.  No associated chest pain, shortness of breath, cough, or cardiac history.     Past Medical History:  Diagnosis Date  . Dyslipidemia   . Endometriosis     Patient Active Problem List   Diagnosis Date Noted  . Inactivity 05/01/2018  . Myalgia 11/02/2017  . Prediabetes 09/04/2017  . Elevated LDL cholesterol level 08/31/2017  . History of smoking 08/31/2017  . Breast cyst, right 07/05/2017  . 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 Hypertriglyceridemia 07/05/2017  . Family history of mixed hyperlipidemia 07/05/2017  . Family history of coronary artery disease in grandfather 07/05/2017  . Family history of thyroid disease- mom; hypo 07/05/2017  . Nicotine abuse 07/05/2017  . Tobacco abuse counseling 07/05/2017  . Vitamin D deficiency 07/05/2017  . lower Leg pain, anterior, left 07/05/2017  . Neuropathic pain- L lower leg 07/05/2017  . Endometriosis - s/p Hysterectomy/BSO; 2010     Past Surgical History:  Procedure Laterality Date  . ABDOMINAL HYSTERECTOMY    . BREAST CYST EXCISION    . LAPAROSCOPY    . TUBAL LIGATION       OB History    Gravida  2   Para  2   Term      Preterm      AB      Living        SAB      TAB      Ectopic      Multiple      Live Births               Home Medications    Prior to Admission medications   Medication Sig Start Date End Date Taking? Authorizing Provider  fluticasone (FLONASE) 50 MCG/ACT nasal spray Place 1 spray into both nostrils daily. 07/07/18   Linus Mako B, NP  ipratropium (ATROVENT) 0.06 % nasal spray Place 2 sprays into both nostrils 4 (four) times daily. 07/07/18   Georgetta Haber, NP  methocarbamol (ROBAXIN) 500 MG tablet Take 1 tablet (500 mg total) by mouth 2 (two) times daily. 08/08/18   Hedges, Tinnie Gens, PA-C  MULTIPLE VITAMIN PO Take by mouth.    [provider]  Omega-3 Fatty Acids (FISH OIL) 1000 MG CAPS Take 1 capsule by mouth daily.    [provider]    Family History Family History  Problem Relation Age of Onset  . Hyperlipidemia Father   . Cancer Maternal Grandmother   . Breast cancer Maternal Grandmother   . Heart disease Paternal Grandfather     Social  History Social History   Tobacco Use  . Smoking status: Former Smoker    Last attempt to quit: 02/28/2014    Years since quitting: 5.0  . Smokeless tobacco: Never Used  Substance Use Topics  . Alcohol use: No  . Drug use: No     Allergies   Patient has no known allergies.   Review of Systems Review of Systems  Constitutional: Negative for activity change.  Respiratory: Negative for shortness of breath.   Cardiovascular: Negative for chest pain.  Musculoskeletal: Positive for arthralgias and myalgias. Negative for neck pain.  Skin: Negative for wound.  Neurological: Negative for weakness and numbness.     Physical Exam Updated Vital Signs BP (!) 134/91 (BP Location: Left Arm)   Pulse 86   Temp 99.4 F (37.4 C) (Oral)   Resp 16   Ht  (1.702 m)   Wt 74.8 kg   SpO2 100%   BMI 25.84 kg/m   Physical Exam Vitals signs and nursing note  reviewed.  Constitutional:      Appearance: She is well-developed.  HENT:     Head: Normocephalic and atraumatic.  Eyes:     Pupils: Pupils are equal, round, and reactive to light.  Neck:     Musculoskeletal: Normal range of motion and neck supple.  Cardiovascular:     Pulses: Normal pulses. No decreased pulses.          Radial pulses are 2+ on the right side and 2+ on the left side.  Musculoskeletal:        General: Tenderness present.     Left shoulder: She exhibits decreased range of motion. She exhibits no tenderness and no bony tenderness.     Left elbow: She exhibits normal range of motion. No tenderness found.     Left wrist: Normal.     Left upper arm: She exhibits tenderness. She exhibits no bony tenderness, no swelling and no edema.     Left forearm: Normal.     Left hand: Normal.     Comments: Patient has tenderness over the left biceps muscle.  Pain in the area increases with flexion at the elbow, especially flexion against resistance.  No focal pain or tenderness in the shoulder.  Distal sensation and radial pulse intact.  Compartments of the forearm and upper arm are soft.  Skin:    General: Skin is warm and dry.  Neurological:     Mental Status: She is alert.     Sensory: No sensory deficit.     Comments: Motor, sensation, and vascular distal to the injury is fully intact.       ED Treatments / Results  Labs (all labs ordered are listed, but only abnormal results are displayed) Labs Reviewed - No data to display  EKG EKG Interpretation  Date/Time:  Monday Mar 18 2019 03:13:23 EDT Ventricular Rate:  83 PR Interval:    QRS Duration: 97 QT Interval:  372 QTC Calculation: 438 R Axis:   73 Text Interpretation:  Duplicate Delete Confirmed by Paula Libra ([id number]) on 03/18/2019 3:18:59 AM   Radiology No results found.  Procedures Procedures (including critical care time)  Medications Ordered in ED Medications  ketorolac (TORADOL) 15 MG/ML injection 15 mg  (has no administration in time range)     Initial Impression / Assessment and Plan / ED Course  I have reviewed the triage vital signs and the nursing notes.  Pertinent labs & imaging results that were available during my care  of the patient were reviewed by me and considered in my medical decision making (see chart for details).        Patient seen and examined.  Patient with left upper arm pain without injury.  Upper extremity appears to be well perfused and neurologically intact.  Pain seems to be centered around the left biceps muscle.  I do not feel any tenseness in the compartments of the arm.  She does not have any neck pain to suggest cervical radiculopathy.  She denies any chest pain or shortness of breath but will check EKG.  Will give IM Toradol, sling.  Will give prescription for NSAIDs and Robaxin for home.  Encouraged heat on the area.  Encouraged return with worsening numbness or weakness of the arm or if she notices a color change in the skin.  Will give orthopedic referral in case symptoms are not improving over the next week.  Vital signs reviewed and are as follows: BP (!) 134/91 (BP Location: Left Arm)   Pulse 86   Temp 99.4 F (37.4 C) (Oral)   Resp 16   Ht 5\' 7"  (1.702 m)   Wt 74.8 kg   SpO2 100%   BMI 25.84 kg/m   EKG personally reviewed. Home after meds.    Final Clinical Impressions(s) / ED Diagnoses   Final diagnoses:  Left upper arm pain   Patient with gradual onset of left upper arm pain that is worse with flexion of her arm and raising her shoulder.  Pain seems to be focused over the biceps muscle.  No visual signs of cellulitis or other rash.  No distal numbness or tingling.  Normal strength.  Does not really sound radicular in nature.  Upper extremity appears to be neurovascularly intact with normal radial pulse.  Will treat as MSK for the time being and have patient follow-up with her doctor or orthopedic referral if not improving.  EKG reviewed  given her complaint and does not show any signs of ischemia.  Low concern for cardiac or pulmonary etiology given lack of chest pain or shortness of breath.  ED Discharge Orders    None       Renne CriglerGeiple, Esme Durkin, Cordelia Poche-C 03/18/19 0321    Molpus, Jonny RuizJohn, MD 03/18/19 [phone]

## 2019-08-13 ENCOUNTER — Other Ambulatory Visit: Payer: Self-pay | Admitting: Family Medicine

## 2019-08-13 DIAGNOSIS — Z1231 Encounter for screening mammogram for malignant neoplasm of breast: Secondary | ICD-10-CM

## 2019-11-22 ENCOUNTER — Other Ambulatory Visit: Payer: Self-pay

## 2019-11-22 ENCOUNTER — Ambulatory Visit
Admission: RE | Admit: 2019-11-22 | Discharge: 2019-11-22 | Disposition: A | Discharge status: Home / Self Care | Payer: BC Managed Care – PPO | Source: Ambulatory Visit | Attending: Family Medicine | Admitting: Family Medicine

## 2019-11-22 DIAGNOSIS — Z1231 Encounter for screening mammogram for malignant neoplasm of breast: Secondary | ICD-10-CM

## 2020-08-03 IMAGING — MG DIGITAL SCREENING BILAT W/ TOMO W/ CAD
8 series · 9 of 24 positions shown · non-contrast
Comparison: Previous exam(s).

CLINICAL DATA: Screening.

EXAM:
DIGITAL SCREENING BILATERAL MAMMOGRAM WITH TOMO AND CAD

[R MLO synth-2D]
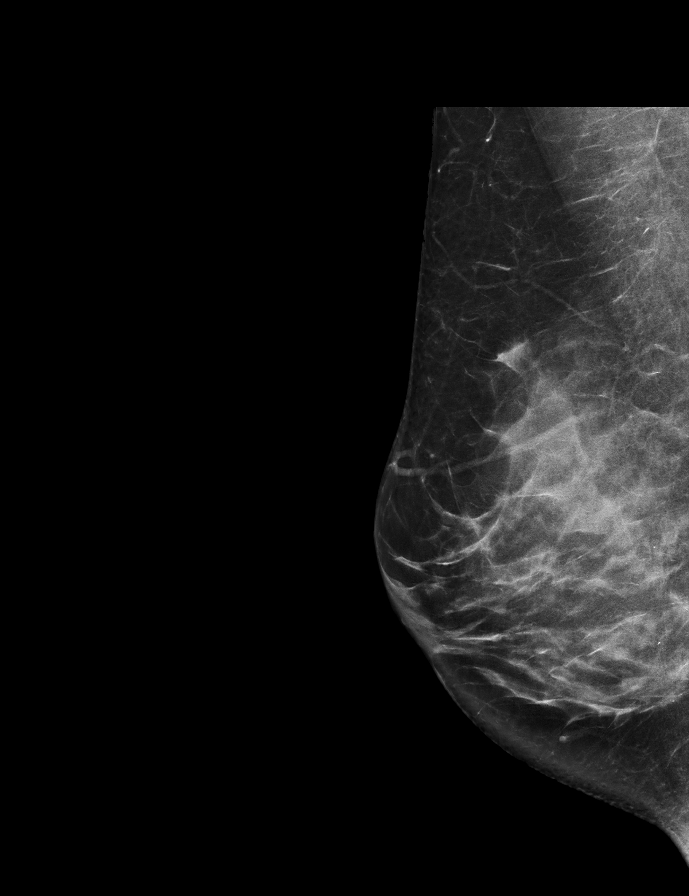

[R CC synth-2D]
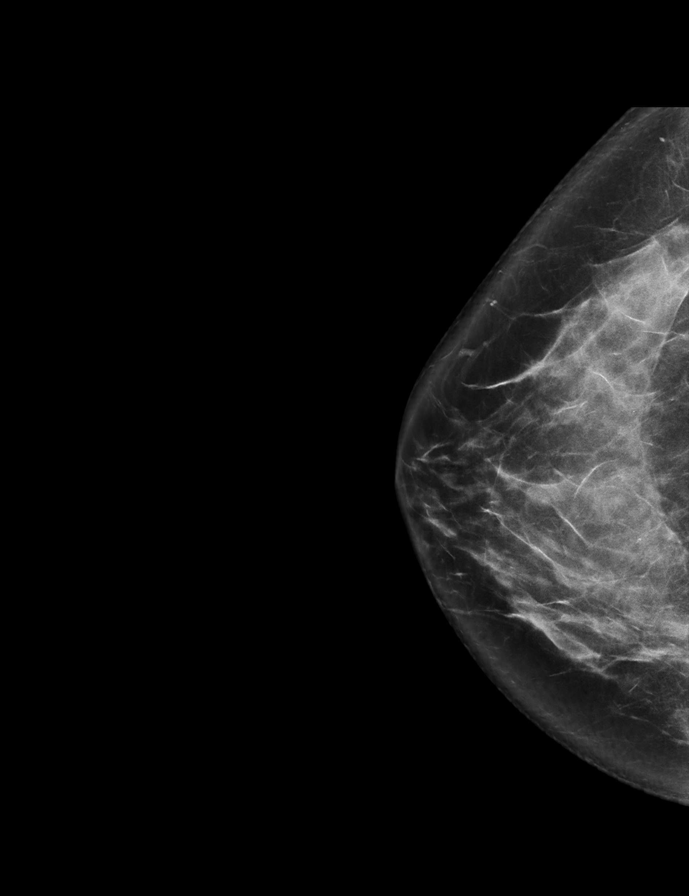

[L MLO synth-2D]
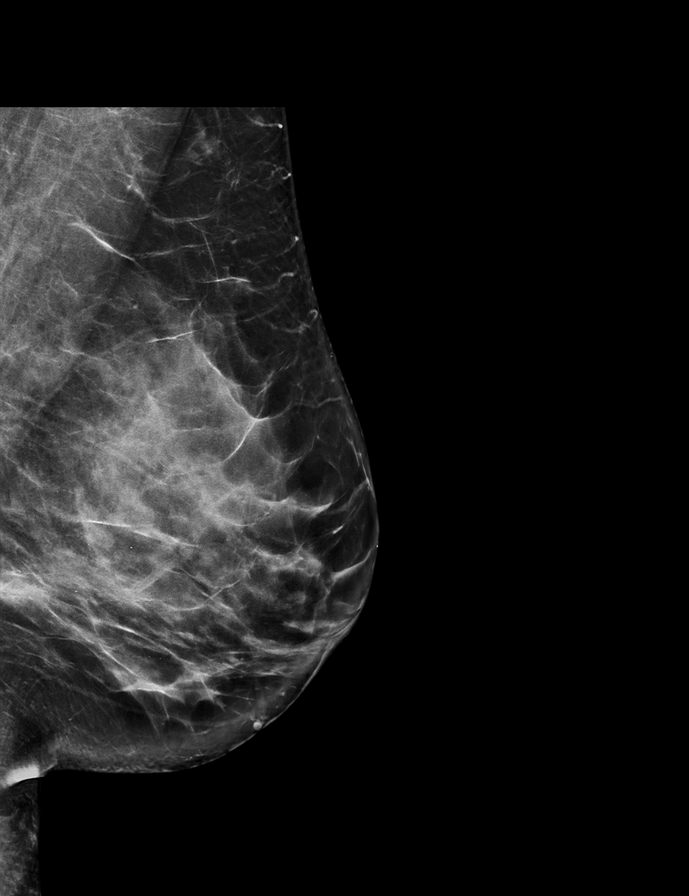

[L CC synth-2D]
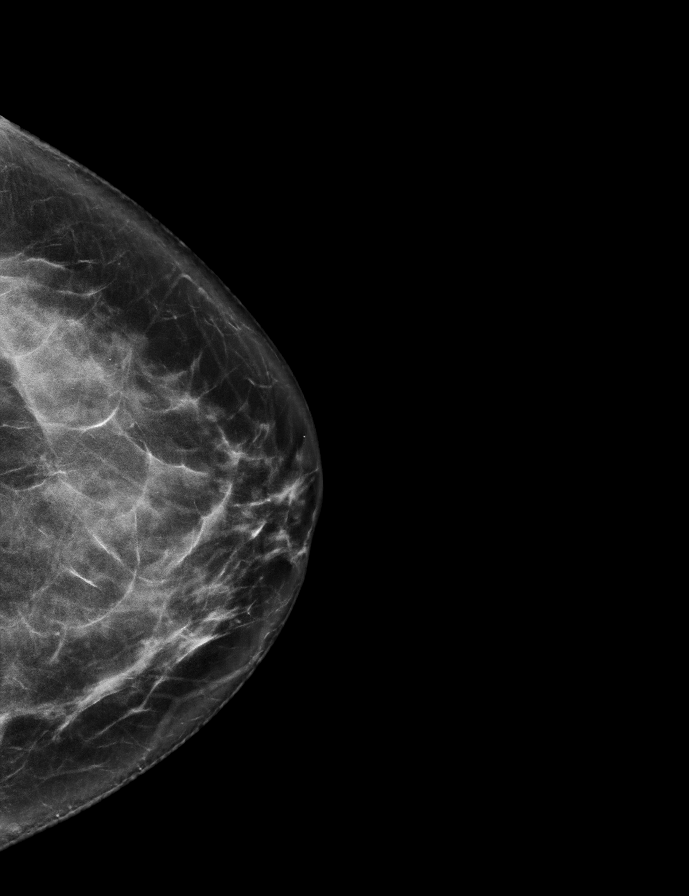

[R MLO tomo · 2 of 73 frames shown]
[frame 24/73]
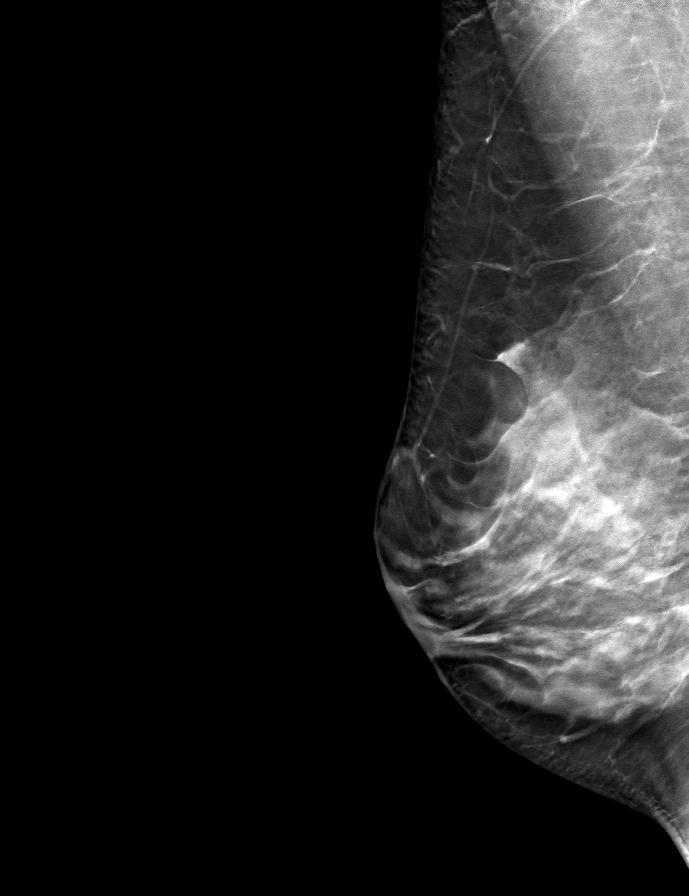
[frame 37/73]
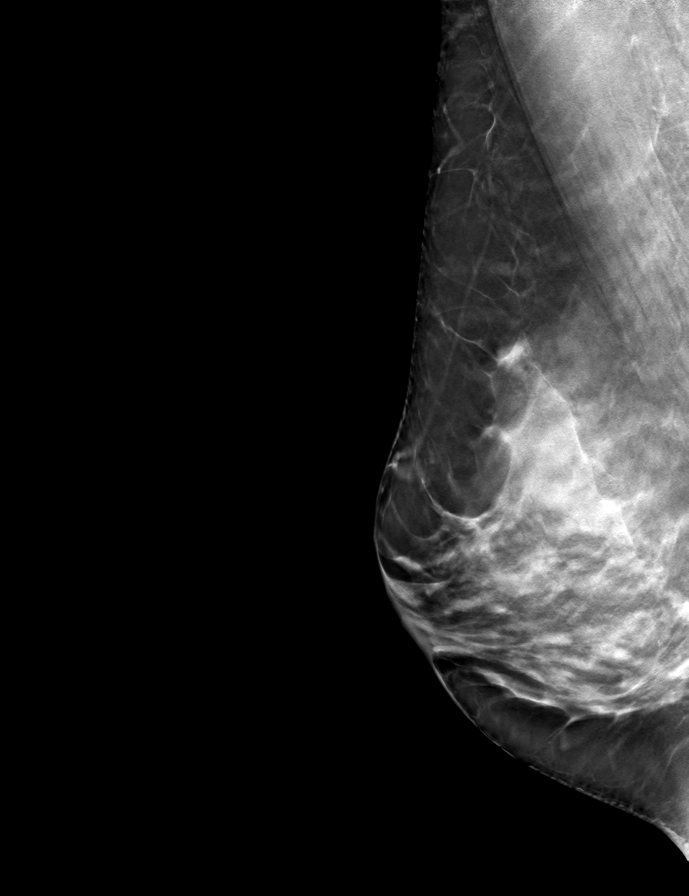

[R CC tomo · tomo slice 39/76.0]
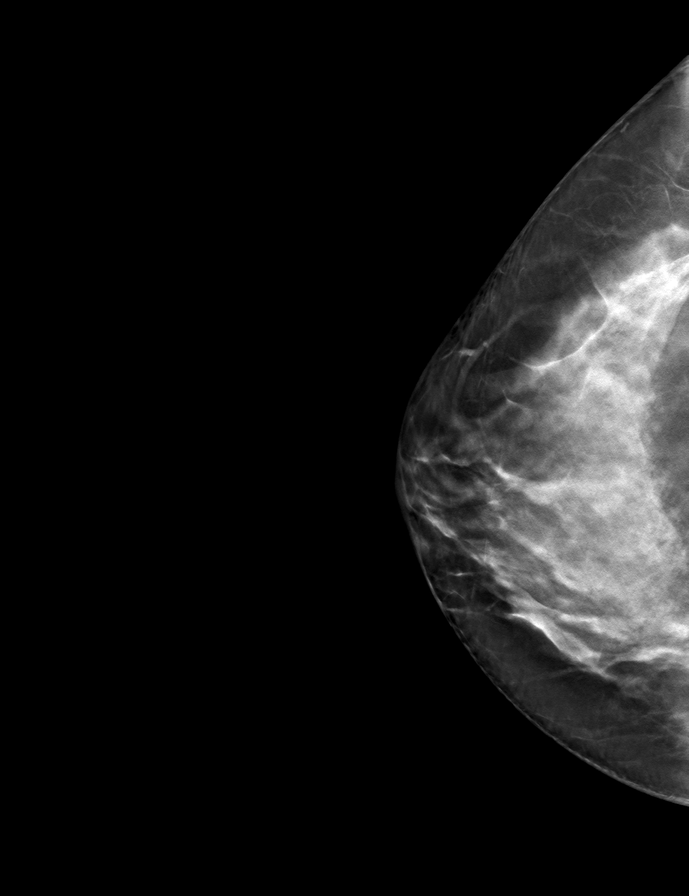

[L CC tomo · tomo slice 37/73.0]
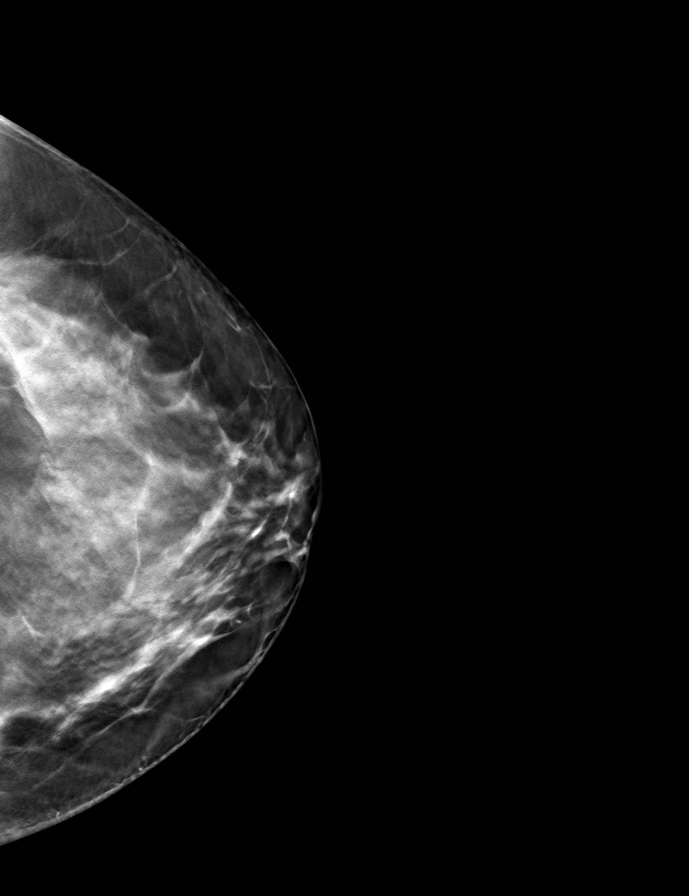

[L MLO tomo · tomo slice 39/77.0]
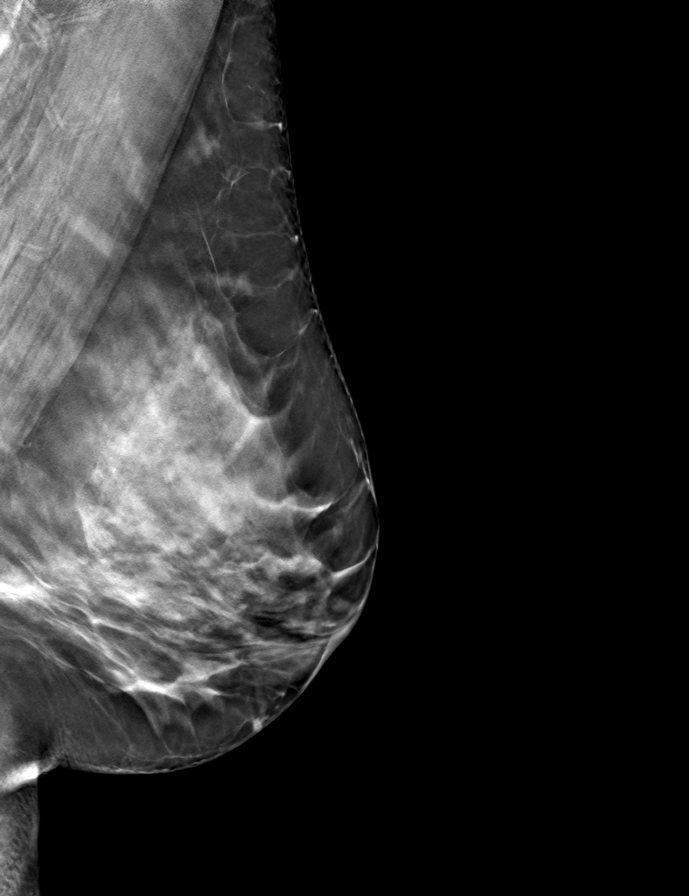

[9 of 24 positions shown; findings below may reference images not displayed]

ACR Breast Density Category d: The breast tissue is extremely dense,
which lowers the sensitivity of mammography
FINDINGS: There are no findings suspicious for malignancy. Images were
processed with CAD.
IMPRESSION: No mammographic evidence of malignancy. A result letter of this
screening mammogram will be mailed directly to the patient.

RECOMMENDATION:
Screening mammogram in one year. (Code:WO-0-ZI0)

BI-RADS CATEGORY  1: Negative.

## 2021-01-22 ENCOUNTER — Other Ambulatory Visit: Payer: Self-pay | Admitting: Family Medicine

## 2021-01-22 DIAGNOSIS — N644 Mastodynia: Secondary | ICD-10-CM

## 2021-03-08 ENCOUNTER — Other Ambulatory Visit: Payer: Self-pay

## 2021-03-08 ENCOUNTER — Ambulatory Visit
Admission: RE | Admit: 2021-03-08 | Discharge: 2021-03-08 | Disposition: A | Discharge status: Home / Self Care | Payer: BC Managed Care – PPO | Source: Ambulatory Visit | Attending: Family Medicine | Admitting: Family Medicine

## 2021-03-08 DIAGNOSIS — N644 Mastodynia: Secondary | ICD-10-CM

## 2021-11-18 IMAGING — US US BREAST*L* LIMITED INC AXILLA
1 series · 6 of 6 positions shown · non-contrast
Comparison: Previous exam(s).

CLINICAL DATA: 44-year-old female with focal, intermittent left
breast pain. Patient states she has had a cyst aspirated in this
region previously.

EXAM:
DIGITAL DIAGNOSTIC BILATERAL MAMMOGRAM WITH TOMOSYNTHESIS AND CAD;
ULTRASOUND LEFT BREAST LIMITED
TECHNIQUE: Bilateral digital diagnostic mammography and breast tomosynthesis
was performed. The images were evaluated with computer-aided
detection.; Targeted ultrasound examination of the left breast was
performed

[Series 1: us breast*left* limited inc axilla · 0.06mm/px · 6 of 6 slices shown]
[im 1/6]
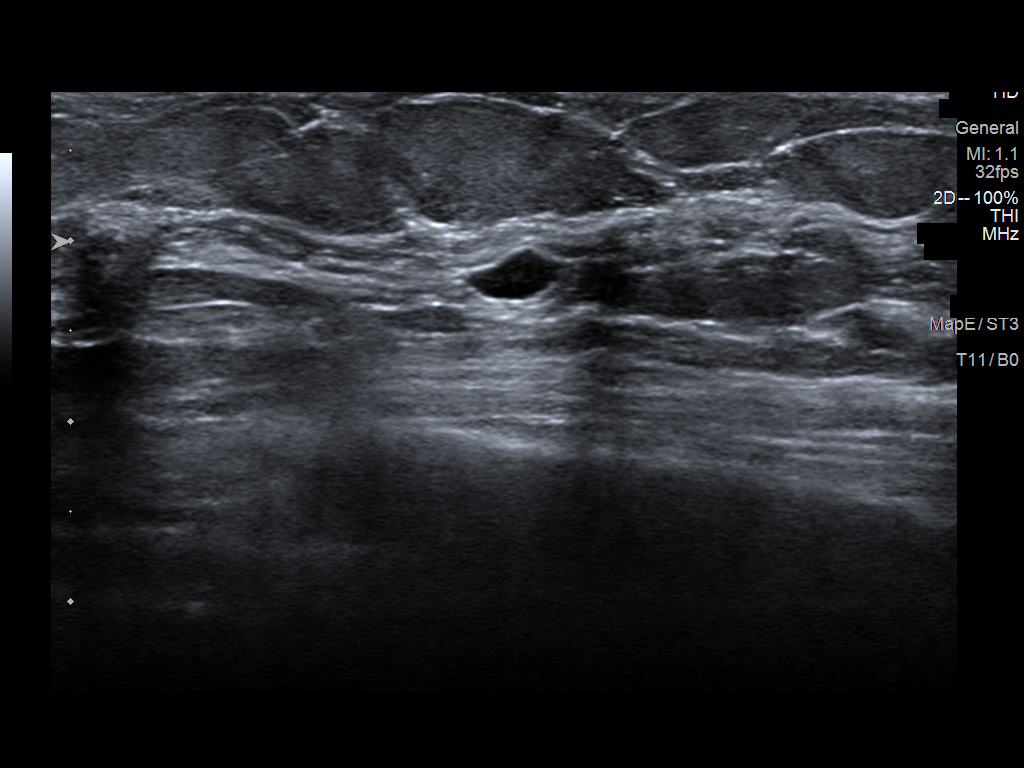
[im 2/6]
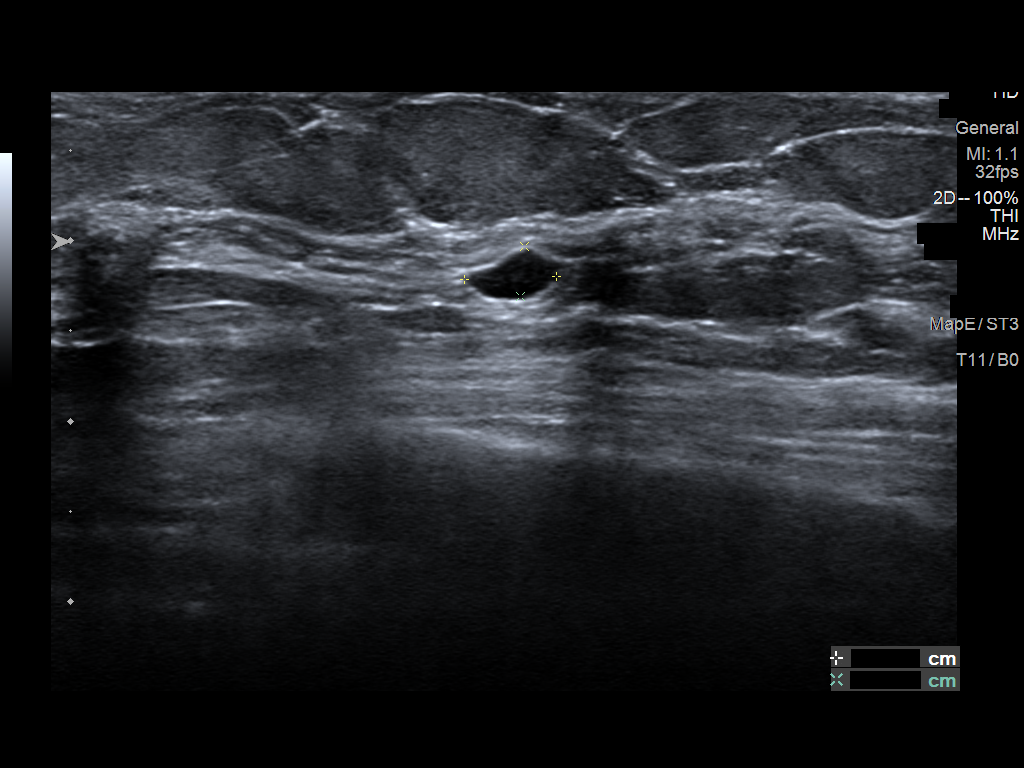
[im 3/6]
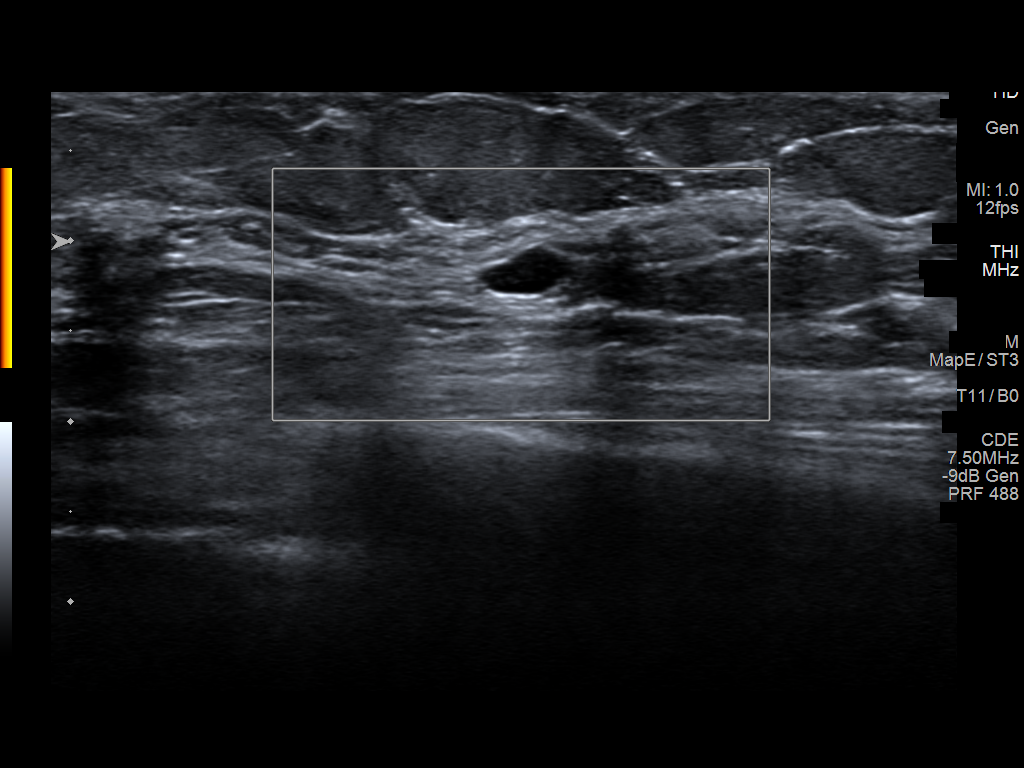
[im 4/6]
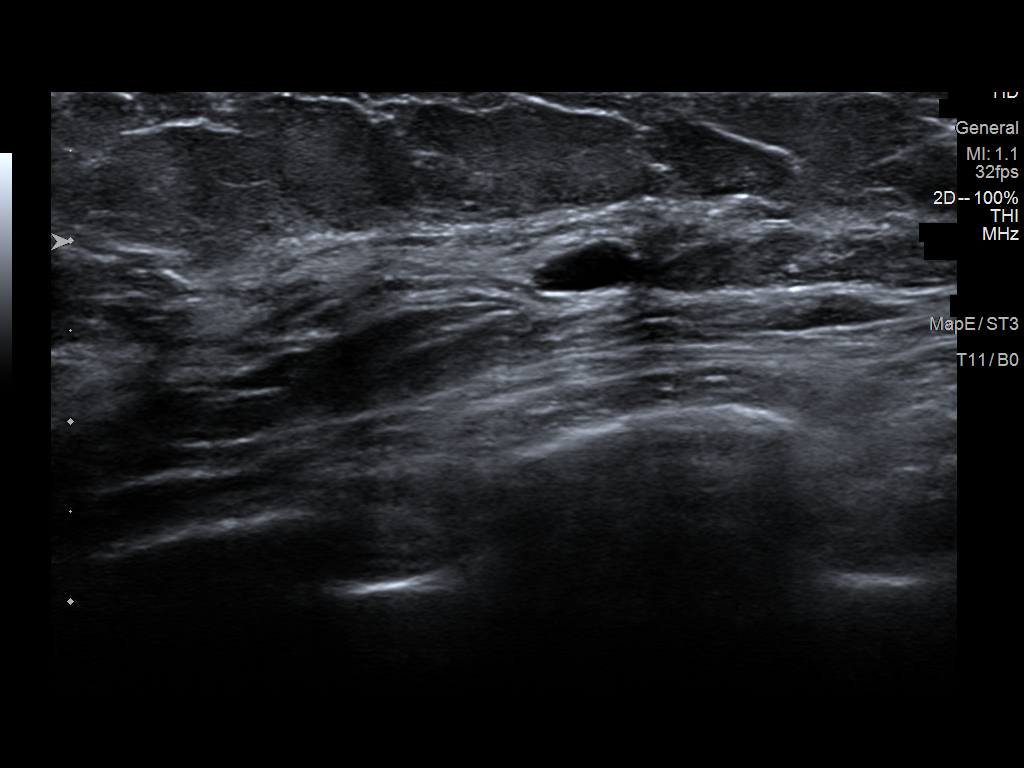
[im 5/6]
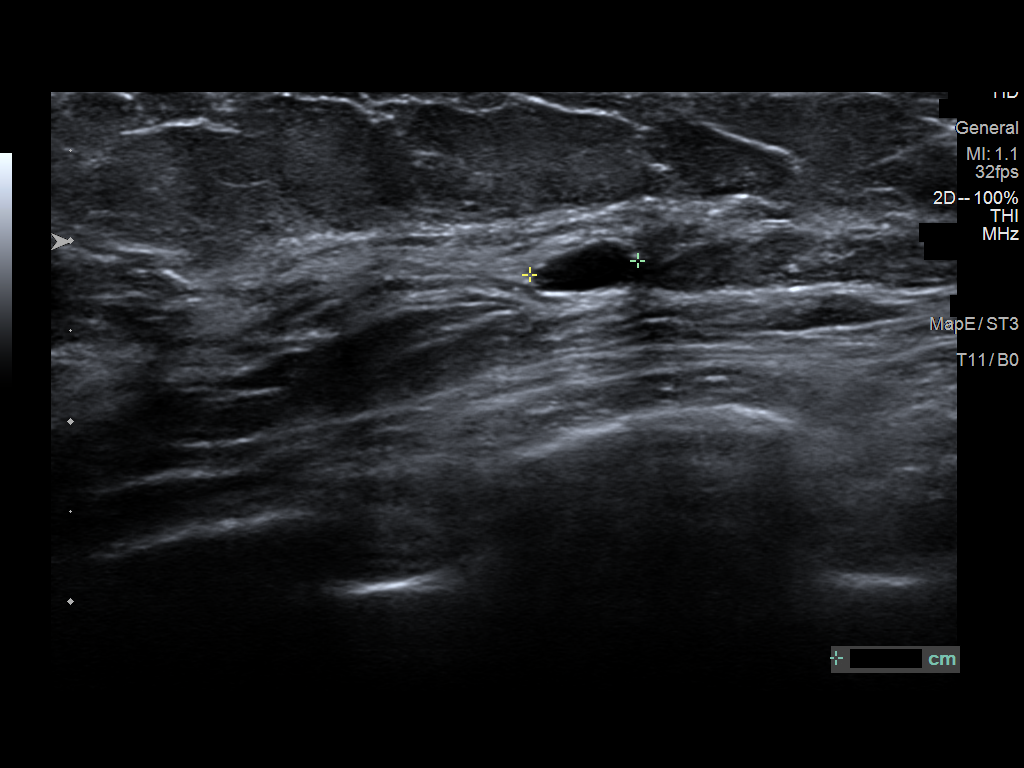
[im 6/6]
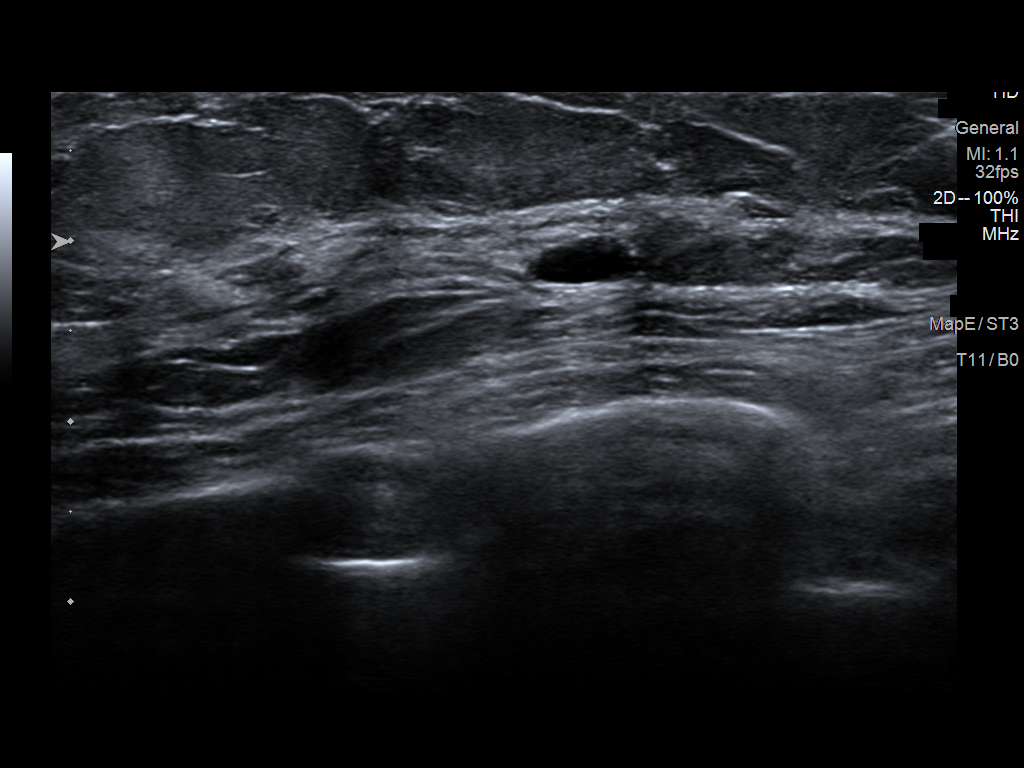

[6 of 6 positions shown; findings below may reference images not displayed]

ACR Breast Density Category c: The breast tissue is heterogeneously
dense, which may obscure small masses.
FINDINGS: A radiopaque BB was placed at the site of the patient's focal
symptoms in the lower outer left breast. No focal or suspicious
mammographic findings are seen deep to the radiopaque BB. No
suspicious findings are identified in the remainder of either
breast.

Targeted ultrasound is performed, showing no suspicious sonographic
findings at the site of the patient's intermittent left breast pain
at the 4 o'clock position. An incidental oval, circumscribed
anechoic cyst is noted at the 4 o'clock position 6 cm from the
nipple. It measures 6 x 5 x 3 mm. There is no internal vascularity.
IMPRESSION: 1. No mammographic or sonographic findings to explain the patient's
focal, intermittent left breast pain. Recommendation is for clinical
and symptomatic follow-up.
2. Incidental, benign left breast simple cyst. No further follow-up
required.
3. No mammographic evidence of malignancy on the right.

RECOMMENDATION:
1. Clinical follow-up recommended for the painful area of concern in
the left breast. Any further workup should be based on clinical
grounds.
2.  Screening mammogram in one year.(Code:3A-5-H0U)

I have discussed the findings and recommendations with the patient.
If applicable, a reminder letter will be sent to the patient
regarding the next appointment.

BI-RADS CATEGORY  2: Benign.

## 2022-02-01 ENCOUNTER — Other Ambulatory Visit: Payer: Self-pay | Admitting: Rheumatology

## 2022-02-01 DIAGNOSIS — R7989 Other specified abnormal findings of blood chemistry: Secondary | ICD-10-CM

## 2022-02-04 ENCOUNTER — Ambulatory Visit
Admission: RE | Admit: 2022-02-04 | Discharge: 2022-02-04 | Disposition: A | Discharge status: Home / Self Care | Payer: BC Managed Care – PPO | Source: Ambulatory Visit | Attending: Rheumatology | Admitting: Rheumatology

## 2022-02-04 DIAGNOSIS — R7989 Other specified abnormal findings of blood chemistry: Secondary | ICD-10-CM

## 2022-10-17 IMAGING — US US ABDOMEN COMPLETE
1 series · 14 of 25 positions shown · non-contrast
Comparison: None.

CLINICAL DATA: Elevated LFT

EXAM:
ABDOMEN ULTRASOUND COMPLETE

[Series 1: us abdomen complete · 0.15mm/px · 14 of 73 slices shown]
[im 1/73]
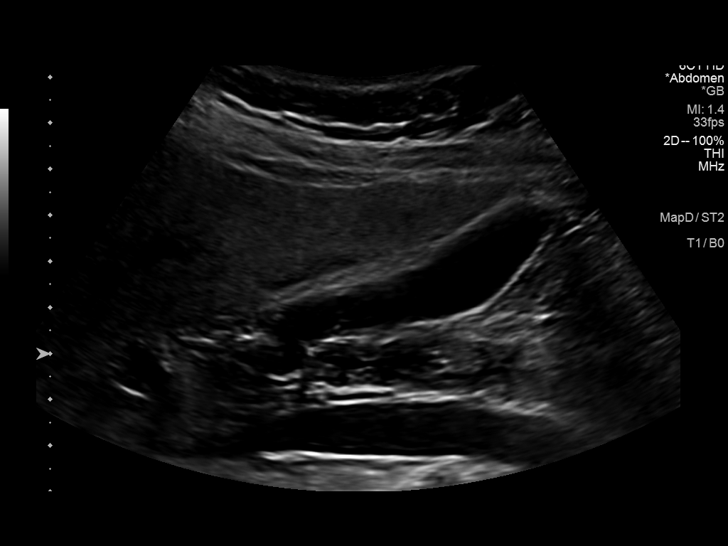
[im 7/73]
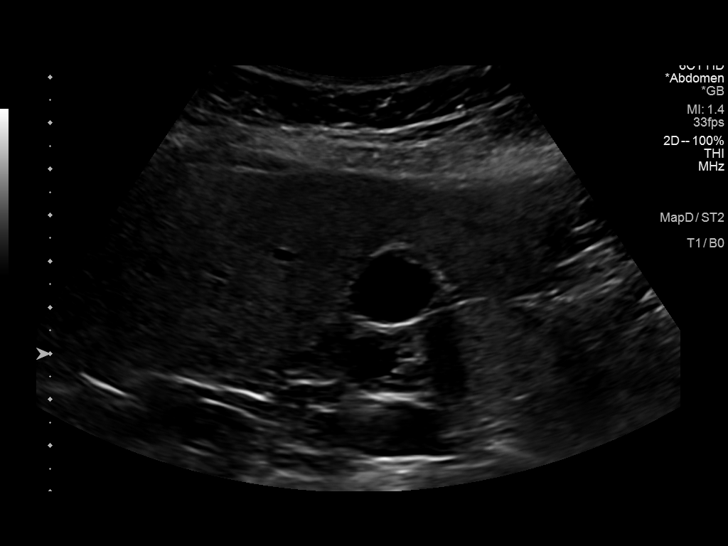
[im 13/73]
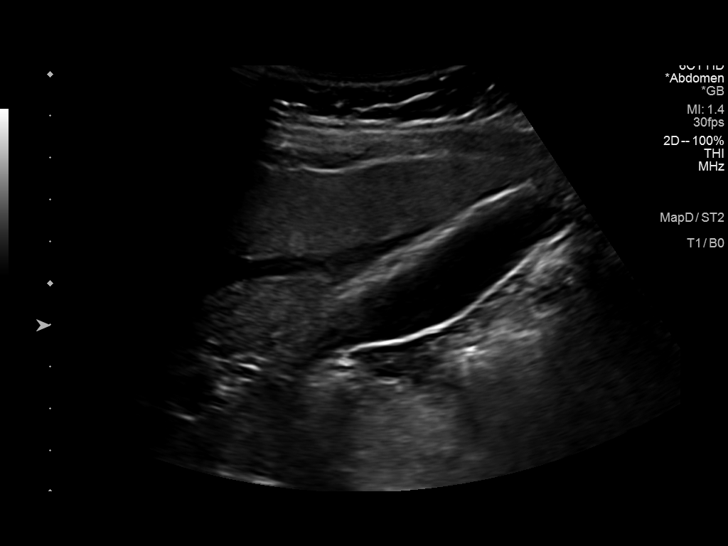
[im 19/73]
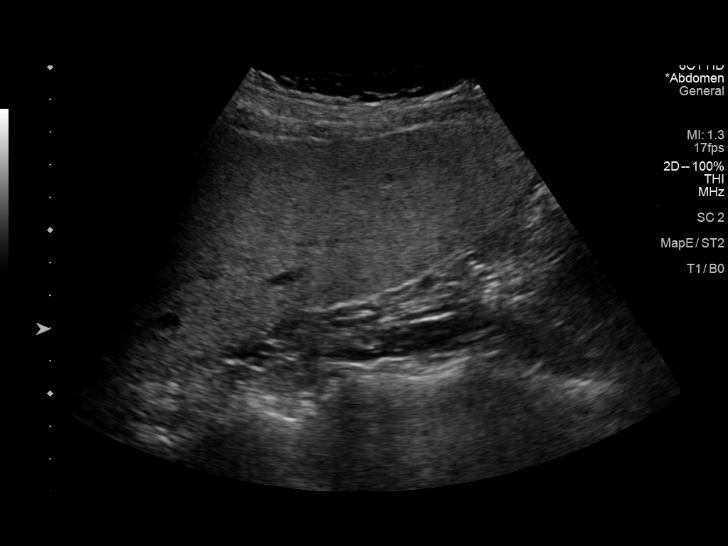
[im 25/73]
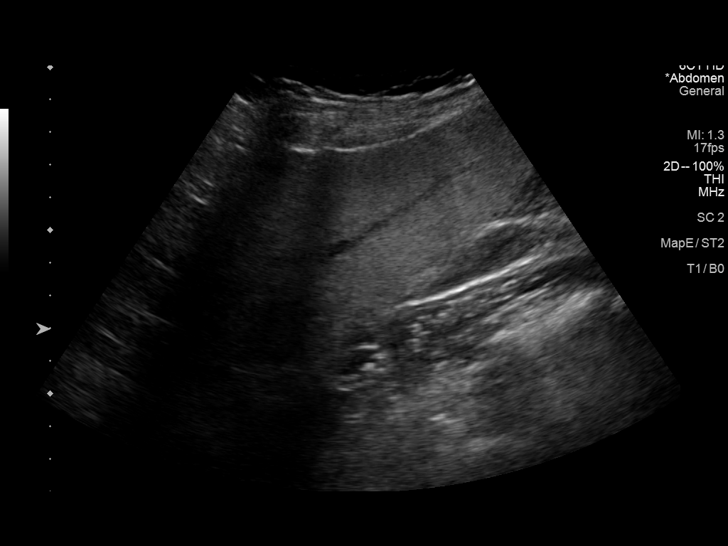
[im 28/73]
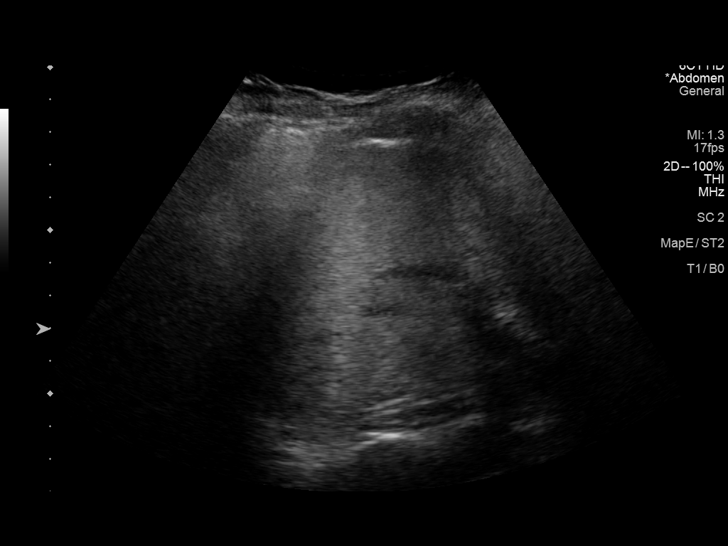
[im 34/73]
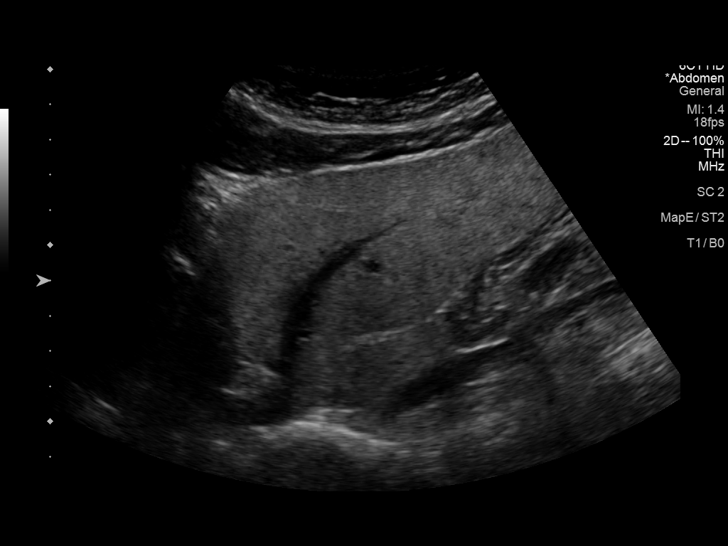
[im 40/73]
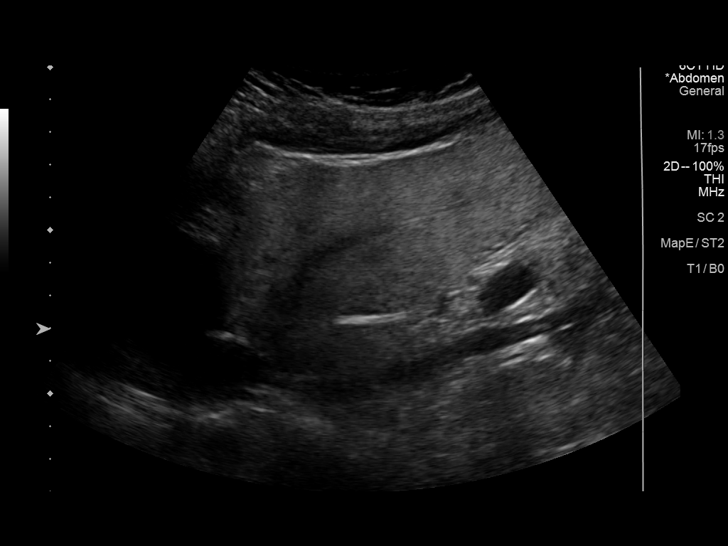
[im 46/73]
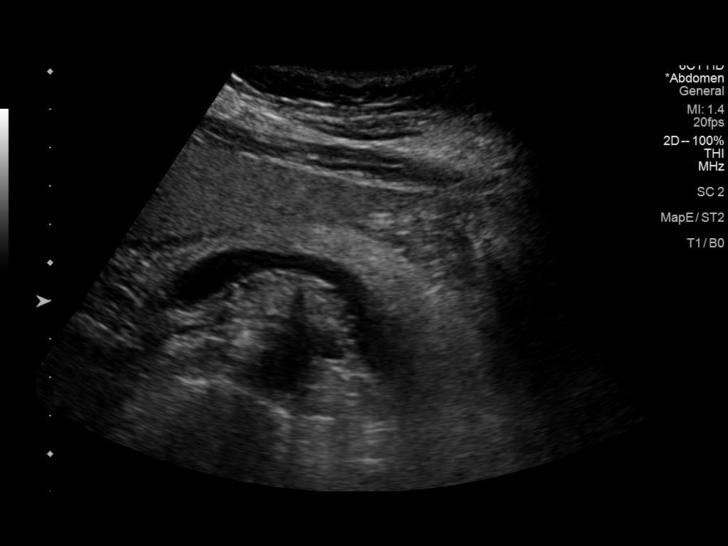
[im 49/73]
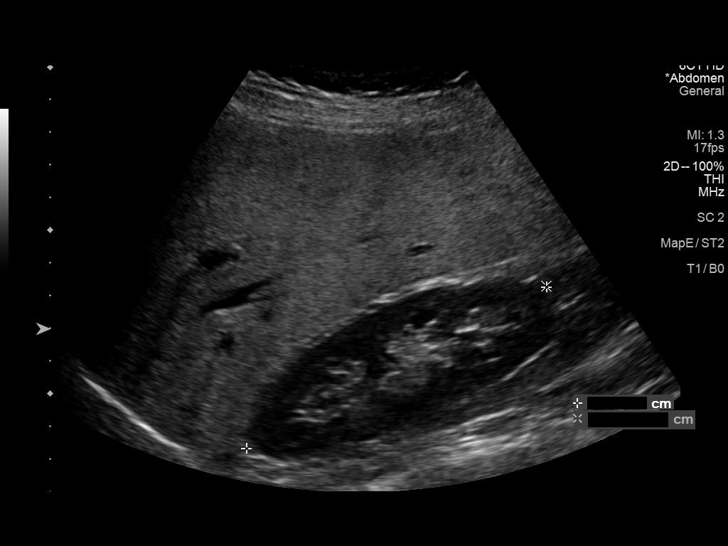
[im 55/73]
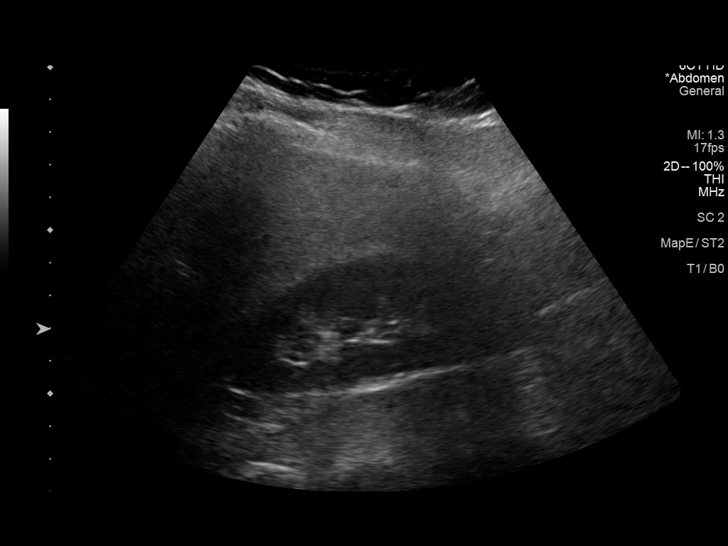
[im 61/73]
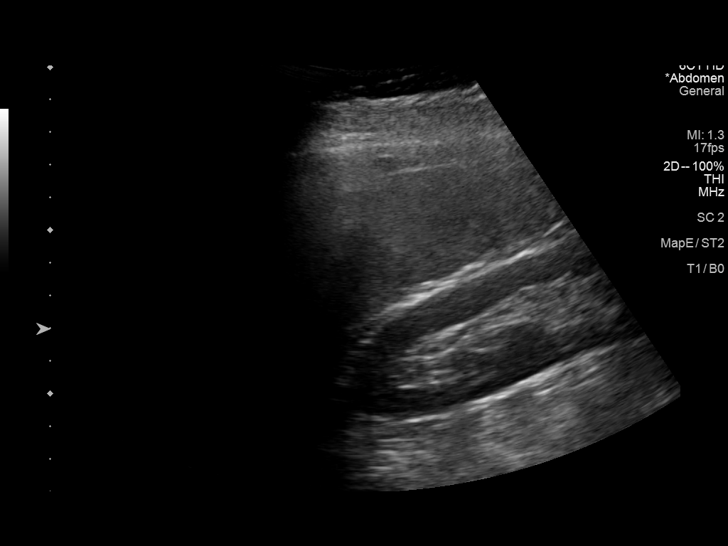
[im 67/73]
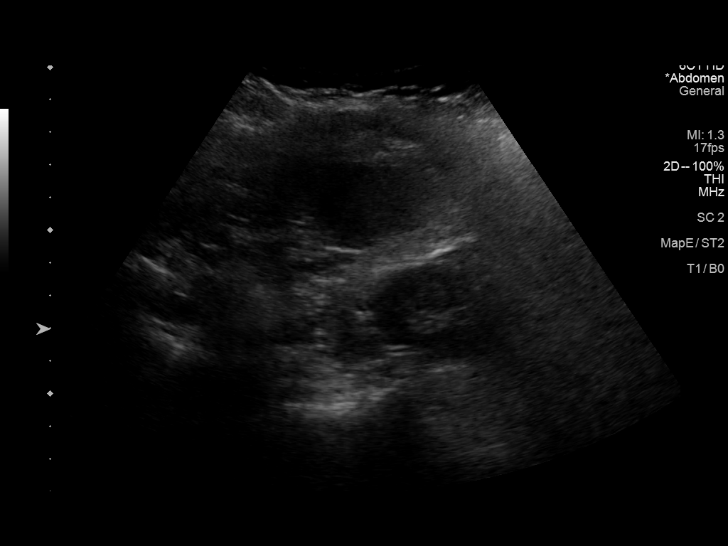
[im 73/73]
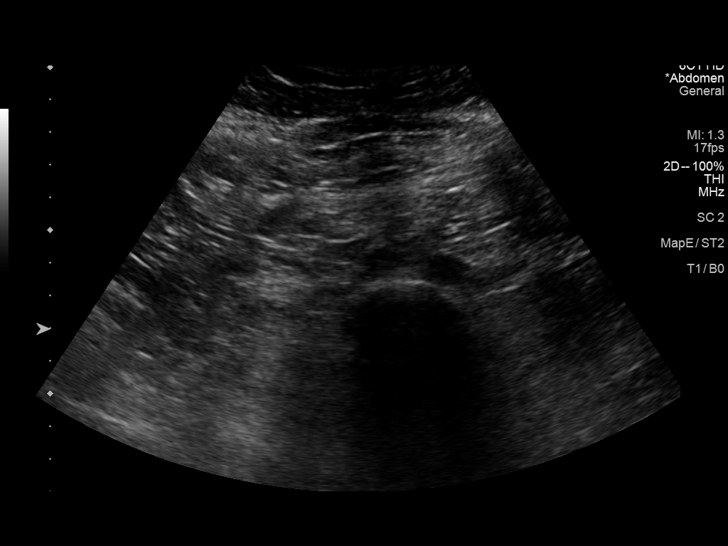

[14 of 25 positions shown; findings below may reference images not displayed]

FINDINGS: Gallbladder: No gallstones or wall thickening visualized. No
sonographic Murphy sign noted by sonographer.

Common bile duct: Diameter: 2.1 mm

Liver: Diffusely echogenic. No focal hepatic abnormality. Portal
vein is patent on color Doppler imaging with normal direction of
blood flow towards the liver.

IVC: No abnormality visualized.

Pancreas: Visualized portion unremarkable.

Spleen: Size and appearance within normal limits.

Right Kidney: Length: 10.4 cm. Echogenicity within normal limits. No
mass or hydronephrosis visualized.

Left Kidney: Length: 11.8 cm. Echogenicity within normal limits. No
mass or hydronephrosis visualized.

Abdominal aorta: No aneurysm visualized.

Other findings: None.
IMPRESSION: 1. Echogenic liver parenchyma consistent with hepatic steatosis and
or hepatocellular disease.
2. Otherwise negative abdominal ultrasound

## 2023-12-06 ENCOUNTER — Encounter: Payer: Self-pay | Admitting: Internal Medicine

## 2023-12-22 ENCOUNTER — Ambulatory Visit: Payer: Self-pay

## 2023-12-22 VITALS — Ht 67.0 in | Wt 150.0 lb

## 2023-12-22 DIAGNOSIS — Z1211 Encounter for screening for malignant neoplasm of colon: Secondary | ICD-10-CM

## 2023-12-22 MED ORDER — SUFLAVE 178.7 G PO SOLR: 1.0000 | ORAL | 0 refills | Status: AC

## 2023-12-22 NOTE — Progress Notes (Signed)

## 2024-01-11 ENCOUNTER — Encounter: Payer: Self-pay | Admitting: Internal Medicine

## 2024-01-19 ENCOUNTER — Encounter: Payer: Self-pay | Admitting: Internal Medicine

## 2024-01-19 ENCOUNTER — Ambulatory Visit: Payer: BC Managed Care – PPO | Admitting: Internal Medicine

## 2024-01-19 VITALS — BP 98/66 | HR 68 | Temp 97.5°F | Resp 13 | Ht 67.0 in | Wt 150.0 lb

## 2024-01-19 DIAGNOSIS — D124 Benign neoplasm of descending colon: Secondary | ICD-10-CM

## 2024-01-19 DIAGNOSIS — D122 Benign neoplasm of ascending colon: Secondary | ICD-10-CM

## 2024-01-19 DIAGNOSIS — K635 Polyp of colon: Secondary | ICD-10-CM

## 2024-01-19 DIAGNOSIS — Z1211 Encounter for screening for malignant neoplasm of colon: Secondary | ICD-10-CM | POA: Diagnosis present

## 2024-01-19 DIAGNOSIS — K648 Other hemorrhoids: Secondary | ICD-10-CM

## 2024-01-19 DIAGNOSIS — D123 Benign neoplasm of transverse colon: Secondary | ICD-10-CM | POA: Diagnosis not present

## 2024-01-19 MED ORDER — SODIUM CHLORIDE 0.9 % IV SOLN: 500.0000 mL | Freq: Once | INTRAVENOUS | Status: DC

## 2024-01-19 NOTE — Progress Notes (Signed)
 GASTROENTEROLOGY PROCEDURE H&P NOTE   Primary Care Physician: Bryon Lions, PA-C    Reason for Procedure:   Colon cancer screening  Plan:    Colonoscopy  Patient is appropriate for endoscopic procedure(s) in the ambulatory ([facility]) setting.  The nature of the procedure, as well as the risks, benefits, and alternatives were carefully and thoroughly reviewed with the patient. Ample time for discussion and questions allowed. The patient understood, was satisfied, and agreed to proceed.     HPI: Alison Mayer is a 48 y.o. female who presents for colonoscopy for colon cancer screening. Denies blood in stools, changes in bowel habits, or unintentional weight loss. Denies family history of colon cancer. This her first colonoscopy.  Past Medical History:  Diagnosis Date   Dyslipidemia    Endometriosis     Past Surgical History:  Procedure Laterality Date   ABDOMINAL HYSTERECTOMY     BREAST CYST ASPIRATION Right    LAPAROSCOPY     TUBAL LIGATION      Prior to Admission medications   Medication Sig Start Date End Date Taking? Authorizing Provider  atorvastatin (LIPITOR) 20 MG tablet Take 1 tablet by mouth daily. 11/21/23   [provider]  PEG 3350-KCl-NaCl-NaSulf-MgSul (SUFLAVE) 178.7 g SOLR Take 1 kit by mouth as directed. 12/22/23   Imogene Burn, MD  XELJANZ XR 11 MG TB24 Take 1 tablet by mouth daily.    [provider]    Current Outpatient Medications  Medication Sig Dispense Refill   atorvastatin (LIPITOR) 20 MG tablet Take 1 tablet by mouth daily.     PEG 3350-KCl-NaCl-NaSulf-MgSul (SUFLAVE) 178.7 g SOLR Take 1 kit by mouth as directed. 1 each 0   XELJANZ XR 11 MG TB24 Take 1 tablet by mouth daily.     No current facility-administered medications for this visit.    Allergies as of 01/19/2024   (No Known Allergies)    Family History  Problem Relation Age of Onset   Hyperlipidemia Father    Breast cancer Paternal Aunt    Cancer Maternal  Grandmother    Breast cancer Maternal Grandmother        60's   Heart disease Paternal Grandfather    Colon cancer Neg Hx    Rectal cancer Neg Hx    Stomach cancer Neg Hx     Social History   Socioeconomic History   Marital status: Legally Separated    Spouse name: Not on file   Number of children: Not on file   Years of education: Not on file   Highest education level: Not on file  Occupational History   Not on file  Tobacco Use   Smoking status: Former    Current packs/day: 0.00    Types: Cigarettes    Quit date: 02/28/2014    Years since quitting: 9.8   Smokeless tobacco: Never  Vaping Use   Vaping status: Never Used  Substance and Sexual Activity   Alcohol use: No   Drug use: No   Sexual activity: Yes    Birth control/protection: None    Comment: Hysterectomy  Other Topics Concern   Not on file  Social History Narrative   Not on file   Social Drivers of Health   Financial Resource Strain: Low Risk  (12/25/2023)   Received from [facility]   Overall Financial Resource Strain (CARDIA)    Difficulty of Paying Living Expenses: Not hard at all  Food Insecurity: No Food Insecurity (12/25/2023)   Received  from [facility]   Hunger Vital Sign    Worried About Running Out of Food in the Last Year: Never true    Ran Out of Food in the Last Year: Never true  Transportation Needs: No Transportation Needs (12/25/2023)   Received from [facility] - Transportation    Lack of Transportation (Medical): No    Lack of Transportation (Non-Medical): No  Physical Activity: Sufficiently Active (09/12/2023)   Received from [facility]   Exercise Vital Sign    Days of Exercise per Week: 5 days    Minutes of Exercise per Session: 30 min  Stress: No Stress Concern Present (09/12/2023)   Received from [facility] - Occupational Stress Questionnaire    Feeling of Stress : Not at all  Social Connections: Socially  Integrated (09/12/2023)   Received from [facility]   Social Network    How would you rate your social network (family, work, friends)?: Good participation with social networks  Intimate Partner Violence: Not At Risk (09/12/2023)   Received from [facility]   HITS    Over the last 12 months how often did your partner physically hurt you?: Never    Over the last 12 months how often did your partner insult you or talk down to you?: Never    Over the last 12 months how often did your partner threaten you with physical harm?: Never    Over the last 12 months how often did your partner scream or curse at you?: Never    Physical Exam: Vital signs in last 24 hours: There were no vitals taken for this visit. GEN: NAD EYE: Sclerae anicteric ENT: MMM CV: Non-tachycardic Pulm: No increased work of breathing GI: Soft, NT/ND NEURO:  Alert & Oriented   Eulah Pont, MD [facility]  01/19/2024 9:04 AM

## 2024-01-19 NOTE — Progress Notes (Signed)
 Report to PACU, RN, vss, BBS= Clear.

## 2024-01-19 NOTE — Progress Notes (Signed)
 Called to room to assist during endoscopic procedure.  Patient ID and intended procedure confirmed with present staff. Received instructions for my participation in the procedure from the performing physician.

## 2024-01-19 NOTE — Op Note (Signed)
 [facility] Patient Name: Alison Mayer Procedure Date: 01/19/2024 9:41 AM MRN: [id number] Endoscopist: Madelyn Brunner Rio , , [id number] Age: 48 Referring MD:  Date of Birth: 04/02/1976 Gender: Female Account #: [id number] Procedure:                Colonoscopy Indications:              Screening for colorectal malignant neoplasm, This                            is the patient's first colonoscopy Medicines:                Monitored Anesthesia Care Procedure:                Pre-Anesthesia Assessment:                           - Prior to the procedure, a History and Physical                            was performed, and patient medications and                            allergies were reviewed. The patient's tolerance of                            previous anesthesia was also reviewed. The risks                            and benefits of the procedure and the sedation                            options and risks were discussed with the patient.                            All questions were answered, and informed consent                            was obtained. Prior Anticoagulants: The patient has                            taken no anticoagulant or antiplatelet agents. ASA                            Grade Assessment: II - A patient with mild systemic                            disease. After reviewing the risks and benefits,                            the patient was deemed in satisfactory condition to                            undergo the procedure.  After obtaining informed consent, the colonoscope                            was passed under direct vision. Throughout the                            procedure, the patient's blood pressure, pulse, and                            oxygen saturations were monitored continuously. The                            CF [id number] #[id number] was introduced through the anus                            and advanced to  the the terminal ileum. The                            colonoscopy was performed without difficulty. The                            patient tolerated the procedure well. The quality                            of the bowel preparation was excellent. The                            terminal ileum, ileocecal valve, appendiceal                            orifice, and rectum were photographed. Scope In: 9:46:18 AM Scope Out: 10:07:19 AM Scope Withdrawal Time: 0 hours 14 minutes 43 seconds  Total Procedure Duration: 0 hours 21 minutes 1 second  Findings:                 The terminal ileum appeared normal.                           Two sessile polyps were found in the transverse                            colon and ascending colon. The polyps were 4 to 6                            mm in size. These polyps were removed with a cold                            snare. Resection and retrieval were complete.                           A 3 mm polyp was found in the descending colon. The                            polyp was  sessile. The polyp was removed with a                            cold snare. Resection and retrieval were complete.                           Non-bleeding internal hemorrhoids were found during                            retroflexion. Complications:            No immediate complications. Estimated Blood Loss:     Estimated blood loss was minimal. Impression:               - The examined portion of the ileum was normal.                           - Two 4 to 6 mm polyps in the transverse colon and                            in the ascending colon, removed with a cold snare.                            Resected and retrieved.                           - One 3 mm polyp in the descending colon, removed                            with a cold snare. Resected and retrieved.                           - Non-bleeding internal hemorrhoids. Recommendation:           - Discharge patient to home (with  escort).                           - Await pathology results.                           - The findings and recommendations were discussed                            with the patient. Dr Particia Lather "[place]" [place],  01/19/2024 10:10:19 AM

## 2024-01-19 NOTE — Patient Instructions (Signed)
 Resume all of your previous medications as ordered.  Read all of your previous medications as ordered.  YOU HAD AN ENDOSCOPIC PROCEDURE TODAY AT THE [facility]:   Refer to the procedure report that was given to you for any specific questions about what was found during the examination.  If the procedure report does not answer your questions, please call your gastroenterologist to clarify.  If you requested that your care partner not be given the details of your procedure findings, then the procedure report has been included in a sealed envelope for you to review at your convenience later.  YOU SHOULD EXPECT: Some feelings of bloating in the abdomen. Passage of more gas than usual.  Walking can help get rid of the air that was put into your GI tract during the procedure and reduce the bloating. If you had a lower endoscopy (such as a colonoscopy or flexible sigmoidoscopy) you may notice spotting of blood in your stool or on the toilet paper. If you underwent a bowel prep for your procedure, you may not have a normal bowel movement for a few days.  Please Note:  You might notice some irritation and congestion in your nose or some drainage.  This is from the oxygen used during your procedure.  There is no need for concern and it should clear up in a day or so.  SYMPTOMS TO REPORT IMMEDIATELY:  Following lower endoscopy (colonoscopy or flexible sigmoidoscopy):  Excessive amounts of blood in the stool  Significant tenderness or worsening of abdominal pains  Swelling of the abdomen that is new, acute  Fever of 100F or higher   For urgent or emergent issues, a gastroenterologist can be reached at any hour by calling [phone]. Do not use MyChart messaging for urgent concerns.    DIET:  We do recommend a small meal at first, but then you may proceed to your regular diet.  Drink plenty of fluids but you should avoid alcoholic beverages for 24 hours.  ACTIVITY:  You should plan to  take it easy for the rest of today and you should NOT DRIVE or use heavy machinery until tomorrow (because of the sedation medicines used during the test).    FOLLOW UP: Our staff will call the number listed on your records the next business day following your procedure.  We will call around 7:15- 8:00 am to check on you and address any questions or concerns that you may have regarding the information given to you following your procedure. If we do not reach you, we will leave a message.     If any biopsies were taken you will be contacted by phone or by letter within the next 1-3 weeks.  Please call us at [phone] if you have not heard about the biopsies in 3 weeks.    SIGNATURES/CONFIDENTIALITY: You and/or your care partner have signed paperwork which will be entered into your electronic medical record.  These signatures attest to the fact that that the information above on your After Visit Summary has been reviewed and is understood.  Full responsibility of the confidentiality of this discharge information lies with you and/or your care-partner.

## 2024-01-19 NOTE — Progress Notes (Signed)
 Pt's states no medical or surgical changes since previsit or office visit.

## 2024-01-22 ENCOUNTER — Telehealth: Payer: Self-pay | Admitting: *Deleted

## 2024-01-22 NOTE — Telephone Encounter (Signed)
  Follow up Call-     01/19/2024    9:08 AM  Call back number  Post procedure Call Back phone  # [phone]  Permission to leave phone message Yes     Patient questions:  Do you have a fever, pain , or abdominal swelling? No. Pain Score  0 *  Have you tolerated food without any problems? Yes.    Have you been able to return to your normal activities? Yes.    Do you have any questions about your discharge instructions: Diet   No. Medications  No. Follow up visit  No.  Do you have questions or concerns about your Care? No.  Actions: * If pain score is 4 or above: No action needed, pain <4.

## 2024-01-23 LAB — SURGICAL PATHOLOGY

## 2024-01-24 ENCOUNTER — Encounter: Payer: Self-pay | Admitting: Internal Medicine
